# Patient Record
Sex: Female | Born: 1955 | Race: White | Hispanic: No | Smoking: Current some day smoker
Health system: Southern US, Community
[De-identification: ages and names within clinical notes are randomized; demographics above are authoritative.]

## PROBLEM LIST (undated history)

## (undated) DIAGNOSIS — I2510 Atherosclerotic heart disease of native coronary artery without angina pectoris: Secondary | ICD-10-CM

## (undated) DIAGNOSIS — Z973 Presence of spectacles and contact lenses: Secondary | ICD-10-CM

## (undated) DIAGNOSIS — K219 Gastro-esophageal reflux disease without esophagitis: Secondary | ICD-10-CM

## (undated) DIAGNOSIS — I2699 Other pulmonary embolism without acute cor pulmonale: Secondary | ICD-10-CM

## (undated) DIAGNOSIS — E785 Hyperlipidemia, unspecified: Secondary | ICD-10-CM

## (undated) DIAGNOSIS — R7303 Prediabetes: Secondary | ICD-10-CM

## (undated) DIAGNOSIS — M1990 Unspecified osteoarthritis, unspecified site: Secondary | ICD-10-CM

## (undated) DIAGNOSIS — F419 Anxiety disorder, unspecified: Secondary | ICD-10-CM

## (undated) DIAGNOSIS — I82409 Acute embolism and thrombosis of unspecified deep veins of unspecified lower extremity: Secondary | ICD-10-CM

## (undated) DIAGNOSIS — Z95828 Presence of other vascular implants and grafts: Secondary | ICD-10-CM

## (undated) DIAGNOSIS — M5430 Sciatica, unspecified side: Secondary | ICD-10-CM

## (undated) HISTORY — PX: OTHER SURGICAL HISTORY: SHX169

## (undated) HISTORY — PX: BREAST SURGERY: SHX581

## (undated) HISTORY — PX: ANGIOPLASTY: SHX39

## (undated) HISTORY — PX: ABDOMINAL SURGERY: SHX537

## (undated) HISTORY — PX: BACK SURGERY: SHX140

## (undated) HISTORY — PX: KNEE SURGERY: SHX244

## (undated) HISTORY — PX: ABDOMINAL HYSTERECTOMY: SHX81

---

## 2003-08-18 ENCOUNTER — Observation Stay (HOSPITAL_COMMUNITY): Admission: EM | Admit: 2003-08-18 | Discharge: 2003-08-19 | Payer: Self-pay | Admitting: Cardiology

## 2004-06-06 ENCOUNTER — Encounter: Admission: RE | Admit: 2004-06-06 | Discharge: 2004-06-06 | Payer: Self-pay | Admitting: Family Medicine

## 2004-07-11 ENCOUNTER — Encounter: Admission: RE | Admit: 2004-07-11 | Discharge: 2004-07-11 | Payer: Self-pay | Admitting: Family Medicine

## 2004-09-19 ENCOUNTER — Ambulatory Visit: Payer: Self-pay | Admitting: Family Medicine

## 2004-10-17 ENCOUNTER — Ambulatory Visit: Payer: Self-pay | Admitting: Family Medicine

## 2004-10-25 ENCOUNTER — Emergency Department (HOSPITAL_COMMUNITY): Admission: EM | Admit: 2004-10-25 | Discharge: 2004-10-26 | Payer: Self-pay | Admitting: Emergency Medicine

## 2004-11-08 ENCOUNTER — Encounter: Admission: RE | Admit: 2004-11-08 | Discharge: 2004-11-08 | Payer: Self-pay | Admitting: Sports Medicine

## 2005-01-02 ENCOUNTER — Ambulatory Visit: Payer: Self-pay | Admitting: Family Medicine

## 2005-01-09 ENCOUNTER — Ambulatory Visit: Payer: Self-pay | Admitting: Family Medicine

## 2005-05-28 ENCOUNTER — Encounter: Admission: RE | Admit: 2005-05-28 | Discharge: 2005-08-26 | Payer: Self-pay | Admitting: Occupational Medicine

## 2005-06-12 ENCOUNTER — Ambulatory Visit: Payer: Self-pay | Admitting: Family Medicine

## 2005-10-16 ENCOUNTER — Encounter: Admission: RE | Admit: 2005-10-16 | Discharge: 2005-10-16 | Payer: Self-pay | Admitting: Neurosurgery

## 2006-01-16 ENCOUNTER — Encounter: Admission: RE | Admit: 2006-01-16 | Discharge: 2006-01-16 | Payer: Self-pay | Admitting: Neurosurgery

## 2006-06-10 ENCOUNTER — Ambulatory Visit: Payer: Self-pay | Admitting: Family Medicine

## 2006-07-13 ENCOUNTER — Emergency Department (HOSPITAL_COMMUNITY): Admission: AD | Admit: 2006-07-13 | Discharge: 2006-07-13 | Payer: Self-pay | Admitting: Family Medicine

## 2006-10-23 ENCOUNTER — Encounter: Admission: RE | Admit: 2006-10-23 | Discharge: 2006-11-08 | Payer: Self-pay | Admitting: Neurosurgery

## 2006-12-05 ENCOUNTER — Encounter: Admission: RE | Admit: 2006-12-05 | Discharge: 2006-12-05 | Payer: Self-pay | Admitting: Neurosurgery

## 2007-02-20 DIAGNOSIS — F32A Depression, unspecified: Secondary | ICD-10-CM | POA: Insufficient documentation

## 2007-02-20 DIAGNOSIS — K219 Gastro-esophageal reflux disease without esophagitis: Secondary | ICD-10-CM | POA: Insufficient documentation

## 2007-02-20 DIAGNOSIS — M79609 Pain in unspecified limb: Secondary | ICD-10-CM

## 2007-02-20 DIAGNOSIS — G2589 Other specified extrapyramidal and movement disorders: Secondary | ICD-10-CM

## 2007-02-20 DIAGNOSIS — Z87891 Personal history of nicotine dependence: Secondary | ICD-10-CM

## 2007-02-20 DIAGNOSIS — F339 Major depressive disorder, recurrent, unspecified: Secondary | ICD-10-CM | POA: Insufficient documentation

## 2007-02-20 DIAGNOSIS — G4700 Insomnia, unspecified: Secondary | ICD-10-CM

## 2007-02-20 DIAGNOSIS — E669 Obesity, unspecified: Secondary | ICD-10-CM | POA: Insufficient documentation

## 2007-02-20 DIAGNOSIS — K5900 Constipation, unspecified: Secondary | ICD-10-CM | POA: Insufficient documentation

## 2007-07-07 ENCOUNTER — Ambulatory Visit (HOSPITAL_COMMUNITY): Admission: RE | Admit: 2007-07-07 | Discharge: 2007-07-07 | Payer: Self-pay | Admitting: Nurse Practitioner

## 2007-07-07 ENCOUNTER — Ambulatory Visit: Payer: Self-pay | Admitting: Family Medicine

## 2007-07-07 ENCOUNTER — Ambulatory Visit: Payer: Self-pay | Admitting: *Deleted

## 2007-07-07 ENCOUNTER — Ambulatory Visit: Payer: Self-pay | Admitting: Internal Medicine

## 2007-08-04 ENCOUNTER — Ambulatory Visit: Payer: Self-pay | Admitting: Internal Medicine

## 2007-09-02 ENCOUNTER — Ambulatory Visit: Payer: Self-pay | Admitting: Internal Medicine

## 2007-09-02 ENCOUNTER — Encounter (INDEPENDENT_AMBULATORY_CARE_PROVIDER_SITE_OTHER): Payer: Self-pay | Admitting: Nurse Practitioner

## 2007-09-02 LAB — CONVERTED CEMR LAB
ALT: 18 units/L (ref 0–35)
AST: 15 units/L (ref 0–37)
Albumin: 4.5 g/dL (ref 3.5–5.2)
Alkaline Phosphatase: 84 units/L (ref 39–117)
Bilirubin, Direct: 0.1 mg/dL (ref 0.0–0.3)
Cholesterol: 247 mg/dL — ABNORMAL HIGH (ref 0–200)
HDL: 39 mg/dL — ABNORMAL LOW (ref >39)
Indirect Bilirubin: 0.4 mg/dL (ref 0.0–0.9)
LDL Cholesterol: 132 mg/dL — ABNORMAL HIGH (ref 0–99)
Total Bilirubin: 0.5 mg/dL (ref 0.3–1.2)
Total CHOL/HDL Ratio: 6.3
VLDL: 76 mg/dL — ABNORMAL HIGH (ref 0–40)

## 2007-12-03 ENCOUNTER — Encounter (INDEPENDENT_AMBULATORY_CARE_PROVIDER_SITE_OTHER): Payer: Self-pay | Admitting: Nurse Practitioner

## 2007-12-03 ENCOUNTER — Ambulatory Visit: Payer: Self-pay | Admitting: Internal Medicine

## 2007-12-03 LAB — CONVERTED CEMR LAB
ALT: 19 units/L (ref 0–35)
AST: 13 units/L (ref 0–37)
Albumin: 4.5 g/dL (ref 3.5–5.2)
Alkaline Phosphatase: 89 units/L (ref 39–117)
Bilirubin, Direct: 0.1 mg/dL (ref 0.0–0.3)
Cholesterol: 192 mg/dL (ref 0–200)
HDL: 44 mg/dL (ref >39)
Indirect Bilirubin: 0.3 mg/dL (ref 0.0–0.9)
LDL Cholesterol: 93 mg/dL (ref 0–99)
Total Bilirubin: 0.4 mg/dL (ref 0.3–1.2)
Total CHOL/HDL Ratio: 4.4
Total Protein: 7.1 g/dL (ref 6.0–8.3)
Triglycerides: 275 mg/dL — ABNORMAL HIGH (ref <150)
VLDL: 55 mg/dL — ABNORMAL HIGH (ref 0–40)

## 2007-12-15 ENCOUNTER — Ambulatory Visit: Payer: Self-pay | Admitting: Internal Medicine

## 2008-02-19 ENCOUNTER — Encounter: Payer: Self-pay | Admitting: Family Medicine

## 2008-07-05 ENCOUNTER — Encounter (INDEPENDENT_AMBULATORY_CARE_PROVIDER_SITE_OTHER): Payer: Self-pay | Admitting: Family Medicine

## 2008-07-05 ENCOUNTER — Ambulatory Visit: Payer: Self-pay | Admitting: Internal Medicine

## 2008-07-05 LAB — CONVERTED CEMR LAB
ALT: 19 units/L (ref 0–35)
AST: 13 units/L (ref 0–37)
Albumin: 4.7 g/dL (ref 3.5–5.2)
Alkaline Phosphatase: 95 units/L (ref 39–117)
BUN: 14 mg/dL (ref 6–23)
Basophils Absolute: 0.1 10*3/uL (ref 0.0–0.1)
Basophils Relative: 1 % (ref 0–1)
CO2: 22 meq/L (ref 19–32)
Calcium: 9.6 mg/dL (ref 8.4–10.5)
Chloride: 103 meq/L (ref 96–112)
Cholesterol: 204 mg/dL — ABNORMAL HIGH (ref 0–200)
Creatinine, Ser: 0.85 mg/dL (ref 0.40–1.20)
Eosinophils Absolute: 0.2 10*3/uL (ref 0.0–0.7)
Eosinophils Relative: 2 % (ref 0–5)
Glucose, Bld: 84 mg/dL (ref 70–99)
HCT: 48.2 % — ABNORMAL HIGH (ref 36.0–46.0)
HDL: 43 mg/dL (ref >39)
Hemoglobin: 16.1 g/dL — ABNORMAL HIGH (ref 12.0–15.0)
LDL Cholesterol: 106 mg/dL — ABNORMAL HIGH (ref 0–99)
Lymphocytes Relative: 32 % (ref 12–46)
Lymphs Abs: 2.6 10*3/uL (ref 0.7–4.0)
MCHC: 33.4 g/dL (ref 30.0–36.0)
MCV: 93.4 fL (ref 78.0–100.0)
Monocytes Absolute: 0.5 10*3/uL (ref 0.1–1.0)
Monocytes Relative: 5 % (ref 3–12)
Neutro Abs: 5 10*3/uL (ref 1.7–7.7)
Neutrophils Relative %: 60 % (ref 43–77)
Platelets: 232 10*3/uL (ref 150–400)
Potassium: 4.9 meq/L (ref 3.5–5.3)
RBC: 5.16 M/uL — ABNORMAL HIGH (ref 3.87–5.11)
RDW: 14.8 % (ref 11.5–15.5)
Sodium: 139 meq/L (ref 135–145)
TSH: 3.934 microintl units/mL (ref 0.350–4.50)
Total Bilirubin: 0.4 mg/dL (ref 0.3–1.2)
Total CHOL/HDL Ratio: 4.7
Total Protein: 7.4 g/dL (ref 6.0–8.3)
Triglycerides: 273 mg/dL — ABNORMAL HIGH (ref <150)
VLDL: 55 mg/dL — ABNORMAL HIGH (ref 0–40)
WBC: 8.3 10*3/uL (ref 4.0–10.5)

## 2008-07-13 ENCOUNTER — Ambulatory Visit (HOSPITAL_COMMUNITY): Admission: RE | Admit: 2008-07-13 | Discharge: 2008-07-13 | Payer: Self-pay | Admitting: Internal Medicine

## 2008-07-13 ENCOUNTER — Encounter: Payer: Self-pay | Admitting: Internal Medicine

## 2008-07-13 ENCOUNTER — Ambulatory Visit: Payer: Self-pay | Admitting: Vascular Surgery

## 2008-09-06 ENCOUNTER — Ambulatory Visit: Payer: Self-pay | Admitting: Internal Medicine

## 2008-09-06 ENCOUNTER — Encounter (INDEPENDENT_AMBULATORY_CARE_PROVIDER_SITE_OTHER): Payer: Self-pay | Admitting: Family Medicine

## 2008-09-06 LAB — CONVERTED CEMR LAB
ALT: 17 units/L (ref 0–35)
AST: 12 units/L (ref 0–37)
Albumin: 4.7 g/dL (ref 3.5–5.2)
Alkaline Phosphatase: 98 units/L (ref 39–117)
BUN: 20 mg/dL (ref 6–23)
CO2: 25 meq/L (ref 19–32)
Calcium: 9.6 mg/dL (ref 8.4–10.5)
Chloride: 105 meq/L (ref 96–112)
Cholesterol: 153 mg/dL (ref 0–200)
Creatinine, Ser: 0.88 mg/dL (ref 0.40–1.20)
HDL: 55 mg/dL (ref >39)
Potassium: 4.2 meq/L (ref 3.5–5.3)
Sodium: 140 meq/L (ref 135–145)
Total Bilirubin: 0.4 mg/dL (ref 0.3–1.2)
Total CHOL/HDL Ratio: 2.8
Triglycerides: 102 mg/dL (ref <150)

## 2011-05-11 NOTE — Cardiovascular Report (Signed)
NAME:  Laura Hodges, Laura Hodges                           ACCOUNT NO.:  [id number]   MEDICAL RECORD NO.:  [id number]                   PATIENT TYPE:  INP   LOCATION:  2010                                 FACILITY:  [facility]   PHYSICIAN:  Salvadore Farber, M.D.             DATE OF BIRTH:  1956-07-08   DATE OF PROCEDURE:  08/19/2003  DATE OF DISCHARGE:                              CARDIAC CATHETERIZATION   PROCEDURE:  Left heart catheterization, left ventriculography, coronary  angiography.   INDICATIONS:  [name] is a 55 year old lady with risk factors of positive  family history, obesity, and ongoing tobacco abuse who presents with  multiple episodes of brief, but crushing substernal chest pain.  She was  initially evaluated at [facility] and signed out [facility].  She subsequently represented after multiple episodes of pain.  She was  hospitalized and has ruled out for myocardial infarction.  Electrocardiogram  is normal.  Based on her multiple episodes of pain and risk factors, she was  referred directly for coronary angiography.   PROCEDURAL TECHNIQUE:  Informed consent was obtained.  Under 1% lidocaine  local anesthesia a 6-French sheath was placed in the right femoral artery  using the modified Seldinger technique.  Diagnostic angiography and  ventriculography were performed using JL4, JR4, and pigtail catheters.  The  patient tolerated the procedure well and was transferred to the holding room  in stable condition.   COMPLICATIONS:  None.   FINDINGS:  1. LV 107/4/14.  EF 70% without regional wall motion abnormality.  2. No aortic stenosis or mitral regurgitation.  3. Left main:  Angiographically normal.  4. LAD:  Moderate sized vessel giving rise to three diagonal branches.  It     is angiographically normal.  5. Ramus intermedius:  Moderate sized vessel which is angiographically     normal.  6. Circumflex:  Moderate sized vessel giving rise to two obtuse  marginals     which is angiographically normal.  7. RCA:  Moderate sized dominant vessel which has a moderate area of ectasia     in its proximal portion.  There is no stenosis or luminal irregularity.    IMPRESSION/RECOMMENDATIONS:  Normal coronaries with the exception of a  modest area of ectasia in the proximal right coronary.  Recommend continued  primary prevention including smoking cessation.  Suspect noncardiac etiology  to her chest discomfort.  She has been initiated on calcium channel blocker  for consideration of esophageal spasm.  She will follow up with Dr.  Jeanie Sewer.                                               Salvadore Farber, M.D.    WED/MEDQ  D:  08/19/2003  T:  08/19/2003  Job:  [id number]   cc:   Heide Guile, MD  [address]  [place]  [place] [address]  Fax: [phone]   Myra Rude, M.D.

## 2016-01-11 DIAGNOSIS — M25551 Pain in right hip: Secondary | ICD-10-CM | POA: Diagnosis not present

## 2016-01-11 DIAGNOSIS — I10 Essential (primary) hypertension: Secondary | ICD-10-CM | POA: Diagnosis not present

## 2016-01-11 DIAGNOSIS — I2510 Atherosclerotic heart disease of native coronary artery without angina pectoris: Secondary | ICD-10-CM | POA: Diagnosis not present

## 2016-01-11 DIAGNOSIS — E785 Hyperlipidemia, unspecified: Secondary | ICD-10-CM | POA: Diagnosis not present

## 2016-01-17 DIAGNOSIS — E785 Hyperlipidemia, unspecified: Secondary | ICD-10-CM | POA: Diagnosis not present

## 2016-01-17 DIAGNOSIS — I2510 Atherosclerotic heart disease of native coronary artery without angina pectoris: Secondary | ICD-10-CM | POA: Diagnosis not present

## 2016-01-17 DIAGNOSIS — I10 Essential (primary) hypertension: Secondary | ICD-10-CM | POA: Diagnosis not present

## 2016-01-17 DIAGNOSIS — K219 Gastro-esophageal reflux disease without esophagitis: Secondary | ICD-10-CM | POA: Diagnosis not present

## 2016-01-17 DIAGNOSIS — J209 Acute bronchitis, unspecified: Secondary | ICD-10-CM | POA: Diagnosis not present

## 2016-02-08 DIAGNOSIS — I10 Essential (primary) hypertension: Secondary | ICD-10-CM | POA: Diagnosis not present

## 2016-02-08 DIAGNOSIS — I2510 Atherosclerotic heart disease of native coronary artery without angina pectoris: Secondary | ICD-10-CM | POA: Diagnosis not present

## 2016-02-08 DIAGNOSIS — E785 Hyperlipidemia, unspecified: Secondary | ICD-10-CM | POA: Diagnosis not present

## 2016-02-08 DIAGNOSIS — G4700 Insomnia, unspecified: Secondary | ICD-10-CM | POA: Diagnosis not present

## 2016-02-08 DIAGNOSIS — K219 Gastro-esophageal reflux disease without esophagitis: Secondary | ICD-10-CM | POA: Diagnosis not present

## 2016-02-23 DIAGNOSIS — Z1231 Encounter for screening mammogram for malignant neoplasm of breast: Secondary | ICD-10-CM | POA: Diagnosis not present

## 2016-02-28 DIAGNOSIS — M238X2 Other internal derangements of left knee: Secondary | ICD-10-CM | POA: Diagnosis not present

## 2016-02-28 DIAGNOSIS — M25562 Pain in left knee: Secondary | ICD-10-CM | POA: Diagnosis not present

## 2016-03-07 DIAGNOSIS — Z7901 Long term (current) use of anticoagulants: Secondary | ICD-10-CM | POA: Diagnosis not present

## 2016-03-07 DIAGNOSIS — I2510 Atherosclerotic heart disease of native coronary artery without angina pectoris: Secondary | ICD-10-CM | POA: Diagnosis not present

## 2016-03-07 DIAGNOSIS — M25551 Pain in right hip: Secondary | ICD-10-CM | POA: Diagnosis not present

## 2016-03-07 DIAGNOSIS — E785 Hyperlipidemia, unspecified: Secondary | ICD-10-CM | POA: Diagnosis not present

## 2016-03-07 DIAGNOSIS — I10 Essential (primary) hypertension: Secondary | ICD-10-CM | POA: Diagnosis not present

## 2016-03-09 DIAGNOSIS — M25562 Pain in left knee: Secondary | ICD-10-CM | POA: Diagnosis not present

## 2016-03-26 DIAGNOSIS — S83242A Other tear of medial meniscus, current injury, left knee, initial encounter: Secondary | ICD-10-CM | POA: Diagnosis not present

## 2016-03-26 DIAGNOSIS — M25562 Pain in left knee: Secondary | ICD-10-CM | POA: Diagnosis not present

## 2016-03-26 DIAGNOSIS — M1712 Unilateral primary osteoarthritis, left knee: Secondary | ICD-10-CM | POA: Diagnosis not present

## 2016-03-28 DIAGNOSIS — E785 Hyperlipidemia, unspecified: Secondary | ICD-10-CM | POA: Diagnosis not present

## 2016-03-28 DIAGNOSIS — I2510 Atherosclerotic heart disease of native coronary artery without angina pectoris: Secondary | ICD-10-CM | POA: Diagnosis not present

## 2016-03-28 DIAGNOSIS — K219 Gastro-esophageal reflux disease without esophagitis: Secondary | ICD-10-CM | POA: Diagnosis not present

## 2016-03-28 DIAGNOSIS — Z01810 Encounter for preprocedural cardiovascular examination: Secondary | ICD-10-CM | POA: Diagnosis not present

## 2016-03-28 DIAGNOSIS — I10 Essential (primary) hypertension: Secondary | ICD-10-CM | POA: Diagnosis not present

## 2016-04-02 DIAGNOSIS — I2510 Atherosclerotic heart disease of native coronary artery without angina pectoris: Secondary | ICD-10-CM | POA: Diagnosis not present

## 2016-04-02 DIAGNOSIS — G4700 Insomnia, unspecified: Secondary | ICD-10-CM | POA: Diagnosis not present

## 2016-04-02 DIAGNOSIS — K219 Gastro-esophageal reflux disease without esophagitis: Secondary | ICD-10-CM | POA: Diagnosis not present

## 2016-04-02 DIAGNOSIS — E785 Hyperlipidemia, unspecified: Secondary | ICD-10-CM | POA: Diagnosis not present

## 2016-04-02 DIAGNOSIS — I10 Essential (primary) hypertension: Secondary | ICD-10-CM | POA: Diagnosis not present

## 2016-04-04 DIAGNOSIS — Z7901 Long term (current) use of anticoagulants: Secondary | ICD-10-CM | POA: Diagnosis not present

## 2016-04-11 DIAGNOSIS — E785 Hyperlipidemia, unspecified: Secondary | ICD-10-CM | POA: Diagnosis not present

## 2016-04-11 DIAGNOSIS — Z79899 Other long term (current) drug therapy: Secondary | ICD-10-CM | POA: Diagnosis not present

## 2016-04-11 DIAGNOSIS — R791 Abnormal coagulation profile: Secondary | ICD-10-CM | POA: Diagnosis not present

## 2016-04-11 DIAGNOSIS — K219 Gastro-esophageal reflux disease without esophagitis: Secondary | ICD-10-CM | POA: Diagnosis not present

## 2016-04-11 DIAGNOSIS — M25551 Pain in right hip: Secondary | ICD-10-CM | POA: Diagnosis not present

## 2016-04-11 DIAGNOSIS — M159 Polyosteoarthritis, unspecified: Secondary | ICD-10-CM | POA: Diagnosis not present

## 2016-04-23 DIAGNOSIS — Z86711 Personal history of pulmonary embolism: Secondary | ICD-10-CM | POA: Insufficient documentation

## 2016-04-23 DIAGNOSIS — Z01810 Encounter for preprocedural cardiovascular examination: Secondary | ICD-10-CM | POA: Diagnosis not present

## 2016-04-23 DIAGNOSIS — E785 Hyperlipidemia, unspecified: Secondary | ICD-10-CM | POA: Insufficient documentation

## 2016-04-23 DIAGNOSIS — I2510 Atherosclerotic heart disease of native coronary artery without angina pectoris: Secondary | ICD-10-CM | POA: Insufficient documentation

## 2016-04-24 DIAGNOSIS — R791 Abnormal coagulation profile: Secondary | ICD-10-CM | POA: Diagnosis not present

## 2016-04-24 DIAGNOSIS — E785 Hyperlipidemia, unspecified: Secondary | ICD-10-CM | POA: Diagnosis not present

## 2016-04-24 DIAGNOSIS — G4700 Insomnia, unspecified: Secondary | ICD-10-CM | POA: Diagnosis not present

## 2016-04-24 DIAGNOSIS — K219 Gastro-esophageal reflux disease without esophagitis: Secondary | ICD-10-CM | POA: Diagnosis not present

## 2016-04-24 DIAGNOSIS — I2510 Atherosclerotic heart disease of native coronary artery without angina pectoris: Secondary | ICD-10-CM | POA: Diagnosis not present

## 2016-04-27 DIAGNOSIS — E785 Hyperlipidemia, unspecified: Secondary | ICD-10-CM | POA: Diagnosis not present

## 2016-04-27 DIAGNOSIS — Z01810 Encounter for preprocedural cardiovascular examination: Secondary | ICD-10-CM | POA: Diagnosis not present

## 2016-04-27 DIAGNOSIS — I2510 Atherosclerotic heart disease of native coronary artery without angina pectoris: Secondary | ICD-10-CM | POA: Diagnosis not present

## 2016-04-27 DIAGNOSIS — Z86711 Personal history of pulmonary embolism: Secondary | ICD-10-CM | POA: Diagnosis not present

## 2016-04-30 DIAGNOSIS — Z01810 Encounter for preprocedural cardiovascular examination: Secondary | ICD-10-CM | POA: Diagnosis not present

## 2016-04-30 DIAGNOSIS — E785 Hyperlipidemia, unspecified: Secondary | ICD-10-CM | POA: Diagnosis not present

## 2016-04-30 DIAGNOSIS — I2510 Atherosclerotic heart disease of native coronary artery without angina pectoris: Secondary | ICD-10-CM | POA: Diagnosis not present

## 2016-04-30 DIAGNOSIS — Z86711 Personal history of pulmonary embolism: Secondary | ICD-10-CM | POA: Diagnosis not present

## 2016-05-01 DIAGNOSIS — K219 Gastro-esophageal reflux disease without esophagitis: Secondary | ICD-10-CM | POA: Diagnosis not present

## 2016-05-01 DIAGNOSIS — Z79899 Other long term (current) drug therapy: Secondary | ICD-10-CM | POA: Diagnosis not present

## 2016-05-01 DIAGNOSIS — G4700 Insomnia, unspecified: Secondary | ICD-10-CM | POA: Diagnosis not present

## 2016-05-01 DIAGNOSIS — I2510 Atherosclerotic heart disease of native coronary artery without angina pectoris: Secondary | ICD-10-CM | POA: Diagnosis not present

## 2016-05-01 DIAGNOSIS — E785 Hyperlipidemia, unspecified: Secondary | ICD-10-CM | POA: Diagnosis not present

## 2016-05-02 DIAGNOSIS — S83242D Other tear of medial meniscus, current injury, left knee, subsequent encounter: Secondary | ICD-10-CM | POA: Diagnosis not present

## 2016-05-02 DIAGNOSIS — M1712 Unilateral primary osteoarthritis, left knee: Secondary | ICD-10-CM | POA: Diagnosis not present

## 2016-05-18 DIAGNOSIS — M1712 Unilateral primary osteoarthritis, left knee: Secondary | ICD-10-CM | POA: Diagnosis not present

## 2016-05-18 DIAGNOSIS — M5416 Radiculopathy, lumbar region: Secondary | ICD-10-CM | POA: Diagnosis not present

## 2016-05-31 DIAGNOSIS — K219 Gastro-esophageal reflux disease without esophagitis: Secondary | ICD-10-CM | POA: Diagnosis not present

## 2016-05-31 DIAGNOSIS — I10 Essential (primary) hypertension: Secondary | ICD-10-CM | POA: Diagnosis not present

## 2016-05-31 DIAGNOSIS — E785 Hyperlipidemia, unspecified: Secondary | ICD-10-CM | POA: Diagnosis not present

## 2016-05-31 DIAGNOSIS — G4700 Insomnia, unspecified: Secondary | ICD-10-CM | POA: Diagnosis not present

## 2016-05-31 DIAGNOSIS — I2510 Atherosclerotic heart disease of native coronary artery without angina pectoris: Secondary | ICD-10-CM | POA: Diagnosis not present

## 2016-06-18 DIAGNOSIS — I2699 Other pulmonary embolism without acute cor pulmonale: Secondary | ICD-10-CM | POA: Diagnosis not present

## 2016-06-18 DIAGNOSIS — G894 Chronic pain syndrome: Secondary | ICD-10-CM | POA: Diagnosis not present

## 2016-06-18 DIAGNOSIS — M545 Low back pain: Secondary | ICD-10-CM | POA: Diagnosis not present

## 2016-06-18 DIAGNOSIS — M25569 Pain in unspecified knee: Secondary | ICD-10-CM | POA: Diagnosis not present

## 2016-06-18 DIAGNOSIS — Z86718 Personal history of other venous thrombosis and embolism: Secondary | ICD-10-CM | POA: Diagnosis not present

## 2016-07-02 DIAGNOSIS — K219 Gastro-esophageal reflux disease without esophagitis: Secondary | ICD-10-CM | POA: Diagnosis not present

## 2016-07-02 DIAGNOSIS — M25551 Pain in right hip: Secondary | ICD-10-CM | POA: Diagnosis not present

## 2016-07-02 DIAGNOSIS — E785 Hyperlipidemia, unspecified: Secondary | ICD-10-CM | POA: Diagnosis not present

## 2016-07-02 DIAGNOSIS — Z7901 Long term (current) use of anticoagulants: Secondary | ICD-10-CM | POA: Diagnosis not present

## 2016-07-02 DIAGNOSIS — G4700 Insomnia, unspecified: Secondary | ICD-10-CM | POA: Diagnosis not present

## 2016-07-09 DIAGNOSIS — Z7901 Long term (current) use of anticoagulants: Secondary | ICD-10-CM | POA: Diagnosis not present

## 2016-07-09 NOTE — Progress Notes (Signed)
04/23/2016-last office visit from Dr. Normajean Baxteravankar on chart. 04/28/2016-EKG from Dr. Blanchie Serveavanchar and Echo report on [facility] on chart.Marland Kitchen. 04/30/2016-Nuclear Cardiology report from [facility] [place] on chart.

## 2016-07-09 NOTE — Patient Instructions (Addendum)
Laura Hodges  07/09/2016   Your procedure is scheduled on: Tuesday 07/17/2016  [facility]  [place] to 3rd floor to  Short Stay Center at  1245 PM.  Call this number if you have problems the morning of surgery [phone]   Remember: ONLY 1 PERSON MAY GO WITH YOU TO SHORT STAY TO GET  READY MORNING OF YOUR SURGERY.   Do not eat food  :After Midnight.  MAY HAVE CLEAR LIQUIDS FROM MIDNIGHT UP UNTIL 0945 AM THEN NOTHING UNTIL AFTER SURGERY!  SEE LIST BELOW!   CLEAR LIQUID DIET   Foods Allowed                                                                     Foods Excluded  Coffee and tea, regular and decaf  (NO MILKOR CREAM ADDED  )                                                                                                              LIQUIDS YOU CANNOT Plain Jell-O in any flavor                                           see through such as: Fruit ices (not with fruit pulp)                                     milk, soups, orange juice  Iced Popsicles                                    All solid food Carbonated beverages, regular and diet                                    Cranberry, grape and apple juices Sports drinks like Gatorade Lightly seasoned clear broth or consume(fat free) Sugar, honey syrup  Sample Menu Breakfast                                Lunch                                     Supper Cranberry juice  Beef broth                            Chicken broth Jell-O                                     Grape juice                           Apple juice Coffee or tea                        Jell-O                                      Popsicle                                                Coffee or tea                        Coffee or tea  _____________________________________________________________________       Take these medicines the morning of surgery with A SIP OF WATER: Valium if  needed; Omeprazole; Citalopram; Neurontin; Percocet                                  You may not have any metal on your body including hair pins and              piercings  Do not wear jewelry, make-up, lotions, powders or perfumes, deodorant             Do not wear nail polish.  Do not shave  48 hours prior to surgery.                Do not bring valuables to the hospital. [facility] IS NOT             RESPONSIBLE   FOR VALUABLES.  Contacts, dentures or bridgework may not be worn into surgery.  Leave suitcase in the car. After surgery it may be brought to your room.                  Please read over the following fact sheets you were given:MRSA INFORMATION SHEET; INCENTIVE SPIROMETER; BLOOD TRANSFUSION INFORMATION SHEET  _____________________________________________________________________             [facility] - Preparing for Surgery Before surgery, you can play an important role.  Because skin is not sterile, your skin needs to be as free of germs as possible.  You can reduce the number of germs on your skin by washing with CHG (chlorahexidine gluconate) soap before surgery.  CHG is an antiseptic cleaner which kills germs and bonds with the skin to continue killing germs even after washing. Please DO NOT use if you have an allergy to CHG or antibacterial soaps.  If your skin becomes reddened/irritated stop using the CHG and inform your nurse when you arrive at Short Stay. Do not shave (including legs and underarms) for at least 48  hours prior to the first CHG shower.  You may shave your face/neck. Please follow these instructions carefully:  1.  Shower with CHG Soap the night before surgery and the  morning of Surgery.  2.  If you choose to wash your hair, wash your hair first as usual with your  normal  shampoo.  3.  After you shampoo, rinse your hair and body thoroughly to remove the  shampoo.                           4.  Use CHG as you would any other liquid soap.  You can apply  chg directly  to the skin and wash                       Gently with a scrungie or clean washcloth.  5.  Apply the CHG Soap to your body ONLY FROM THE NECK DOWN.   Do not use on face/ open                           Wound or open sores. Avoid contact with eyes, ears mouth and genitals (private parts).                       Wash face,  Genitals (private parts) with your normal soap.             6.  Wash thoroughly, paying special attention to the area where your surgery  will be performed.  7.  Thoroughly rinse your body with warm water from the neck down.  8.  DO NOT shower/wash with your normal soap after using and rinsing off  the CHG Soap.                9.  Pat yourself dry with a clean towel.            10.  Wear clean pajamas.            11.  Place clean sheets on your bed the night of your first shower and do not  sleep with pets. Day of Surgery : Do not apply any lotions/deodorants the morning of surgery.  Please wear clean clothes to the hospital/surgery center.  FAILURE TO FOLLOW THESE INSTRUCTIONS MAY RESULT IN THE CANCELLATION OF YOUR SURGERY PATIENT SIGNATURE_________________________________  NURSE SIGNATURE__________________________________  ________________________________________________________________________   Laura Hodges  An incentive spirometer is a tool that can help keep your lungs clear and active. This tool measures how well you are filling your lungs with each breath. Taking long deep breaths may help reverse or decrease the chance of developing breathing (pulmonary) problems (especially infection) following:  A long period of time when you are unable to move or be active. BEFORE THE PROCEDURE   If the spirometer includes an indicator to show your best effort, your nurse or respiratory therapist will set it to a desired goal.  If possible, sit up straight or lean slightly forward. Try not to slouch.  Hold the incentive spirometer in an upright  position. INSTRUCTIONS FOR USE   Sit on the edge of your bed if possible, or sit up as far as you can in bed or on a chair.  Hold the incentive spirometer in an upright position.  Breathe out normally.  Place the mouthpiece in your mouth and seal your lips tightly around it.  Breathe in slowly and as deeply as possible, raising the piston or the ball toward the top of the column.  Hold your breath for 3-5 seconds or for as long as possible. Allow the piston or ball to fall to the bottom of the column.  Remove the mouthpiece from your mouth and breathe out normally.  Rest for a few seconds and repeat Steps 1 through 7 at least 10 times every 1-2 hours when you are awake. Take your time and take a few normal breaths between deep breaths.  The spirometer may include an indicator to show your best effort. Use the indicator as a goal to work toward during each repetition.  After each set of 10 deep breaths, practice coughing to be sure your lungs are clear. If you have an incision (the cut made at the time of surgery), support your incision when coughing by placing a pillow or rolled up towels firmly against it. Once you are able to get out of bed, walk around indoors and cough well. You may stop using the incentive spirometer when instructed by your caregiver.  RISKS AND COMPLICATIONS  Take your time so you do not get dizzy or light-headed.  If you are in pain, you may need to take or ask for pain medication before doing incentive spirometry. It is harder to take a deep breath if you are having pain. AFTER USE  Rest and breathe slowly and easily.  It can be helpful to keep track of a log of your progress. Your caregiver can provide you with a simple table to help with this. If you are using the spirometer at home, follow these instructions: SEEK MEDICAL CARE IF:   You are having difficultly using the spirometer.  You have trouble using the spirometer as often as instructed.  Your  pain medication is not giving enough relief while using the spirometer.  You develop fever of 100.5 F (38.1 C) or higher. SEEK IMMEDIATE MEDICAL CARE IF:   You cough up bloody sputum that had not been present before.  You develop fever of 102 F (38.9 C) or greater.  You develop worsening pain at or near the incision site. MAKE SURE YOU:   Understand these instructions.  Will watch your condition.  Will get help right away if you are not doing well or get worse. Document Released: 04/22/2007 Document Revised: 03/03/2012 Document Reviewed: 06/23/2007 ExitCare Patient Information 2014 ExitCare, [place].   ________________________________________________________________________  WHAT IS A BLOOD TRANSFUSION? Blood Transfusion Information  A transfusion is the replacement of blood or some of its parts. Blood is made up of multiple cells which provide different functions.  Red blood cells carry oxygen and are used for blood loss replacement.  White blood cells fight against infection.  Platelets control bleeding.  Plasma helps clot blood.  Other blood products are available for specialized needs, such as hemophilia or other clotting disorders. BEFORE THE TRANSFUSION  Who gives blood for transfusions?   Healthy volunteers who are fully evaluated to make sure their blood is safe. This is blood bank blood. Transfusion therapy is the safest it has ever been in the practice of medicine. Before blood is taken from a donor, a complete history is taken to make sure that person has no history of diseases nor engages in risky social behavior (examples are intravenous drug use or sexual activity with multiple partners). The donor's travel history is screened to minimize risk of transmitting infections, such as malaria. The donated blood is tested  for signs of infectious diseases, such as HIV and hepatitis. The blood is then tested to be sure it is compatible with you in order to minimize the  chance of a transfusion reaction. If you or a relative donates blood, this is often done in anticipation of surgery and is not appropriate for emergency situations. It takes many days to process the donated blood. RISKS AND COMPLICATIONS Although transfusion therapy is very safe and saves many lives, the main dangers of transfusion include:   Getting an infectious disease.  Developing a transfusion reaction. This is an allergic reaction to something in the blood you were given. Every precaution is taken to prevent this. The decision to have a blood transfusion has been considered carefully by your caregiver before blood is given. Blood is not given unless the benefits outweigh the risks. AFTER THE TRANSFUSION  Right after receiving a blood transfusion, you will usually feel much better and more energetic. This is especially true if your red blood cells have gotten low (anemic). The transfusion raises the level of the red blood cells which carry oxygen, and this usually causes an energy increase.  The nurse administering the transfusion will monitor you carefully for complications. HOME CARE INSTRUCTIONS  No special instructions are needed after a transfusion. You may find your energy is better. Speak with your caregiver about any limitations on activity for underlying diseases you may have. SEEK MEDICAL CARE IF:   Your condition is not improving after your transfusion.  You develop redness or irritation at the intravenous (IV) site. SEEK IMMEDIATE MEDICAL CARE IF:  Any of the following symptoms occur over the next 12 hours:  Shaking chills.  You have a temperature by mouth above 102 F (38.9 C), not controlled by medicine.  Chest, back, or muscle pain.  People around you feel you are not acting correctly or are confused.  Shortness of breath or difficulty breathing.  Dizziness and fainting.  You get a rash or develop hives.  You have a decrease in urine output.  Your urine  turns a dark color or changes to pink, red, or brown. Any of the following symptoms occur over the next 10 days:  You have a temperature by mouth above 102 F (38.9 C), not controlled by medicine.  Shortness of breath.  Weakness after normal activity.  The white part of the eye turns yellow (jaundice).  You have a decrease in the amount of urine or are urinating less often.  Your urine turns a dark color or changes to pink, red, or brown. Document Released: 12/07/2000 Document Revised: 03/03/2012 Document Reviewed: 07/26/2008 [facility] Patient Information 2014 [place], [place].  _______________________________________________________________________

## 2016-07-10 ENCOUNTER — Encounter (HOSPITAL_COMMUNITY)
Admission: RE | Admit: 2016-07-10 | Discharge: 2016-07-10 | Disposition: A | Discharge status: Home / Self Care | Payer: Medicare Other | Source: Ambulatory Visit | Attending: Orthopedic Surgery | Admitting: Orthopedic Surgery

## 2016-07-10 ENCOUNTER — Encounter (HOSPITAL_COMMUNITY): Payer: Self-pay

## 2016-07-10 DIAGNOSIS — Z01812 Encounter for preprocedural laboratory examination: Secondary | ICD-10-CM | POA: Diagnosis not present

## 2016-07-10 HISTORY — DX: Atherosclerotic heart disease of native coronary artery without angina pectoris: I25.10

## 2016-07-10 HISTORY — DX: Other pulmonary embolism without acute cor pulmonale: I26.99

## 2016-07-10 HISTORY — DX: Anxiety disorder, unspecified: F41.9

## 2016-07-10 HISTORY — DX: Presence of other vascular implants and grafts: Z95.828

## 2016-07-10 HISTORY — DX: Hyperlipidemia, unspecified: E78.5

## 2016-07-10 HISTORY — DX: Presence of spectacles and contact lenses: Z97.3

## 2016-07-10 HISTORY — DX: Gastro-esophageal reflux disease without esophagitis: K21.9

## 2016-07-10 HISTORY — DX: Acute embolism and thrombosis of unspecified deep veins of unspecified lower extremity: I82.409

## 2016-07-10 HISTORY — DX: Unspecified osteoarthritis, unspecified site: M19.90

## 2016-07-10 HISTORY — DX: Sciatica, unspecified side: M54.30

## 2016-07-10 LAB — CBC
HCT: 31.2 % — ABNORMAL LOW (ref 36.0–46.0)
Hemoglobin: 8.9 g/dL — ABNORMAL LOW (ref 12.0–15.0)
MCH: 22.6 pg — ABNORMAL LOW (ref 26.0–34.0)
MCHC: 28.5 g/dL — AB (ref 30.0–36.0)
MCV: 79.4 fL (ref 78.0–100.0)
PLATELETS: 401 10*3/uL — AB (ref 150–400)
RBC: 3.93 MIL/uL (ref 3.87–5.11)
RDW: 18.5 % — AB (ref 11.5–15.5)
WBC: 11.1 10*3/uL — AB (ref 4.0–10.5)

## 2016-07-10 LAB — BASIC METABOLIC PANEL
Anion gap: 8 (ref 5–15)
BUN: 16 mg/dL (ref 6–20)
CHLORIDE: 103 mmol/L (ref 101–111)
CO2: 24 mmol/L (ref 22–32)
CREATININE: 0.77 mg/dL (ref 0.44–1.00)
Calcium: 9 mg/dL (ref 8.9–10.3)
GFR calc Af Amer: >60 mL/min (ref >60)
GFR calc non Af Amer: >60 mL/min (ref >60)
Glucose, Bld: 96 mg/dL (ref 65–99)
Potassium: 4.4 mmol/L (ref 3.5–5.1)
SODIUM: 135 mmol/L (ref 135–145)

## 2016-07-10 LAB — ABO/RH: ABO/RH(D): A POS

## 2016-07-10 LAB — SURGICAL PCR SCREEN
MRSA, PCR: NEGATIVE
STAPHYLOCOCCUS AUREUS: NEGATIVE

## 2016-07-10 NOTE — Progress Notes (Signed)
CBC results per epic per PAT visit 07/10/2016 sent to Dr Charlann Boxerlin

## 2016-07-11 NOTE — H&P (Signed)
TOTAL KNEE ADMISSION H&P  Patient is being admitted for left total knee arthroplasty.  Subjective:  Chief Complaint:  Left knee primary OA / pain  HPI: Laura Hodges, 60 y.o. female, has a history of pain and functional disability in the left knee due to arthritis and has failed non-surgical conservative treatments for greater than 12 weeks to include NSAID's and/or analgesics, corticosteriod injections, use of assistive devices and activity modification.  Onset of symptoms was gradual, starting 2+ years ago with gradually worsening course since that time. The patient noted prior procedures on the knee to include  arthroscopy on the left knee(s).  Patient currently rates pain in the left knee(s) at 9 out of 10 with activity. Patient has night pain, worsening of pain with activity and weight bearing, pain that interferes with activities of daily living, pain with passive range of motion, crepitus and joint swelling.  Patient has evidence of periarticular osteophytes and joint space narrowing by imaging studies.  There is no active infection.   Risks, benefits and expectations were discussed with the patient.  Risks including but not limited to the risk of anesthesia, blood clots, nerve damage, blood vessel damage, failure of the prosthesis, infection and up to and including death.  Patient understand the risks, benefits and expectations and wishes to proceed with surgery.   PCP: Simone CuriaLEE,KEUNG, MD  D/C Plans:      Home  Post-op Meds:       No Rx given  Tranexamic Acid:      To be given - topically  (previous PE)   Decadron:      Is to be given  FYI:     Coumadin with Lovenox  Oxycodone  Dilaudid ok per patient  HHRN - INR and Coumadin checks    Patient Active Problem List   Diagnosis Date Noted  . OBESITY, NOS 02/20/2007  . DEPRESSION, MAJOR, RECURRENT 02/20/2007  . RESTLESS LEGS SYNDROME 02/20/2007  . GASTROESOPHAGEAL REFLUX, NO ESOPHAGITIS 02/20/2007  . CONSTIPATION 02/20/2007  . LEG  PAIN OR KNEE PAIN 02/20/2007  . INSOMNIA NOS 02/20/2007  . TOBACCO USE, QUIT 02/20/2007   Past Medical History  Diagnosis Date  . Coronary artery disease     pt states has 1 stent placed / 4 years ago   . Presence of IVC filter   . Pulmonary embolism (HCC)   . Wears glasses   . Anxiety   . GERD (gastroesophageal reflux disease)   . Arthritis   . Sciatica   . DVT (deep venous thrombosis) (HCC)   . Dyslipidemia     Past Surgical History  Procedure Laterality Date  . Back surgery    . Abdominal hysterectomy    . Angioplasty    . Abdominal surgery    . Breast surgery    . Goiter surgery    . Knee surgery      No prescriptions prior to admission   Allergies  Allergen Reactions  . Doxycycline Other (See Comments)    unknown  . Morphine And Related Hives    Social History  Substance Use Topics  . Smoking status: Current Some Day Smoker -- 0.25 packs/day for 40 years    Types: Cigarettes  . Smokeless tobacco: Never Used  . Alcohol Use: No       Review of Systems  Constitutional: Negative.   HENT: Negative.   Eyes: Negative.   Respiratory: Negative.   Cardiovascular: Negative.   Gastrointestinal: Positive for heartburn and constipation.  Genitourinary: Negative.  Musculoskeletal: Positive for joint pain.  Skin: Negative.   Neurological: Negative.   Endo/Heme/Allergies: Negative.   Psychiatric/Behavioral: Positive for depression. The patient is nervous/anxious and has insomnia.     Objective:  Physical Exam  Constitutional: She is oriented to person, place, and time. She appears well-developed.  HENT:  Head: Normocephalic.  Eyes: Pupils are equal, round, and reactive to light.  Neck: Neck supple. No JVD present. No tracheal deviation present. No thyromegaly present.  Cardiovascular: Normal rate, regular rhythm, normal heart sounds and intact distal pulses.   Respiratory: Effort normal and breath sounds normal. No stridor. No respiratory distress. She has no  wheezes.  GI: Soft. There is no tenderness. There is no guarding.  Musculoskeletal:       Left knee: She exhibits decreased range of motion, swelling and bony tenderness. She exhibits no ecchymosis, no deformity, no laceration and no erythema. Tenderness found.  Lymphadenopathy:    She has no cervical adenopathy.  Neurological: She is alert and oriented to person, place, and time.  Skin: Skin is warm and dry.  Psychiatric: She has a normal mood and affect.    Vital signs in last 24 hours: Temp:  [98.4 F (36.9 C)] 98.4 F (36.9 C) (07/18 0913) Pulse Rate:  [83] 83 (07/18 0913) Resp:  [16] 16 (07/18 0913) BP: (147)/(11) 147/11 mmHg (07/18 0913) SpO2:  [99 %] 99 % (07/18 0913) Weight:  [93.044 kg (205 lb 2 oz)] 93.044 kg (205 lb 2 oz) (07/18 0913)    Imaging Review Plain radiographs demonstrate severe degenerative joint disease of the left knee(s).  The bone quality appears to be good for age and reported activity level.  Assessment/Plan:  End stage arthritis, left knee   The patient history, physical examination, clinical judgment of the provider and imaging studies are consistent with end stage degenerative joint disease of the left knee(s) and total knee arthroplasty is deemed medically necessary. The treatment options including medical management, injection therapy arthroscopy and arthroplasty were discussed at length. The risks and benefits of total knee arthroplasty were presented and reviewed. The risks due to aseptic loosening, infection, stiffness, patella tracking problems, thromboembolic complications and other imponderables were discussed. The patient acknowledged the explanation, agreed to proceed with the plan and consent was signed. Patient is being admitted for inpatient treatment for surgery, pain control, PT, OT, prophylactic antibiotics, VTE prophylaxis, progressive ambulation and ADL's and discharge planning. The patient is planning to be discharged  home.     [name] Aaidyn San   PA-C  07/11/2016, 7:56 AM

## 2016-07-17 ENCOUNTER — Inpatient Hospital Stay (HOSPITAL_COMMUNITY): Payer: Medicare Other | Admitting: Certified Registered"

## 2016-07-17 ENCOUNTER — Encounter (HOSPITAL_COMMUNITY)
Admission: RE | Disposition: A | Discharge status: Home Health | Payer: Self-pay | Source: Ambulatory Visit | Attending: Orthopedic Surgery

## 2016-07-17 ENCOUNTER — Encounter (HOSPITAL_COMMUNITY): Payer: Self-pay | Admitting: *Deleted

## 2016-07-17 ENCOUNTER — Inpatient Hospital Stay (HOSPITAL_COMMUNITY)
Admission: RE | Admit: 2016-07-17 | Discharge: 2016-07-19 | DRG: 470 | Disposition: A | Discharge status: Home Health | Payer: Medicare Other | Source: Ambulatory Visit | Attending: Orthopedic Surgery | Admitting: Orthopedic Surgery

## 2016-07-17 DIAGNOSIS — M25562 Pain in left knee: Secondary | ICD-10-CM | POA: Diagnosis present

## 2016-07-17 DIAGNOSIS — G2581 Restless legs syndrome: Secondary | ICD-10-CM | POA: Diagnosis present

## 2016-07-17 DIAGNOSIS — Z86718 Personal history of other venous thrombosis and embolism: Secondary | ICD-10-CM | POA: Diagnosis not present

## 2016-07-17 DIAGNOSIS — K219 Gastro-esophageal reflux disease without esophagitis: Secondary | ICD-10-CM | POA: Diagnosis not present

## 2016-07-17 DIAGNOSIS — F329 Major depressive disorder, single episode, unspecified: Secondary | ICD-10-CM | POA: Diagnosis present

## 2016-07-17 DIAGNOSIS — Z86711 Personal history of pulmonary embolism: Secondary | ICD-10-CM

## 2016-07-17 DIAGNOSIS — E669 Obesity, unspecified: Secondary | ICD-10-CM | POA: Diagnosis present

## 2016-07-17 DIAGNOSIS — Z955 Presence of coronary angioplasty implant and graft: Secondary | ICD-10-CM | POA: Diagnosis not present

## 2016-07-17 DIAGNOSIS — M1712 Unilateral primary osteoarthritis, left knee: Principal | ICD-10-CM | POA: Diagnosis present

## 2016-07-17 DIAGNOSIS — Z96652 Presence of left artificial knee joint: Secondary | ICD-10-CM

## 2016-07-17 DIAGNOSIS — I2510 Atherosclerotic heart disease of native coronary artery without angina pectoris: Secondary | ICD-10-CM | POA: Diagnosis not present

## 2016-07-17 DIAGNOSIS — E785 Hyperlipidemia, unspecified: Secondary | ICD-10-CM | POA: Diagnosis present

## 2016-07-17 DIAGNOSIS — K5900 Constipation, unspecified: Secondary | ICD-10-CM | POA: Diagnosis not present

## 2016-07-17 DIAGNOSIS — G4700 Insomnia, unspecified: Secondary | ICD-10-CM | POA: Diagnosis present

## 2016-07-17 DIAGNOSIS — R269 Unspecified abnormalities of gait and mobility: Secondary | ICD-10-CM | POA: Diagnosis not present

## 2016-07-17 DIAGNOSIS — Z6836 Body mass index (BMI) 36.0-36.9, adult: Secondary | ICD-10-CM

## 2016-07-17 DIAGNOSIS — F419 Anxiety disorder, unspecified: Secondary | ICD-10-CM | POA: Diagnosis present

## 2016-07-17 DIAGNOSIS — M179 Osteoarthritis of knee, unspecified: Secondary | ICD-10-CM | POA: Diagnosis not present

## 2016-07-17 DIAGNOSIS — F17210 Nicotine dependence, cigarettes, uncomplicated: Secondary | ICD-10-CM | POA: Diagnosis present

## 2016-07-17 DIAGNOSIS — Z96659 Presence of unspecified artificial knee joint: Secondary | ICD-10-CM

## 2016-07-17 HISTORY — PX: TOTAL KNEE ARTHROPLASTY: SHX125

## 2016-07-17 LAB — PROTIME-INR
INR: 1.13 (ref 0.00–1.49)
Prothrombin Time: 14.3 seconds (ref 11.6–15.2)

## 2016-07-17 LAB — TYPE AND SCREEN
ABO/RH(D): A POS
Antibody Screen: NEGATIVE

## 2016-07-17 SURGERY — ARTHROPLASTY, KNEE, TOTAL
Anesthesia: General | Site: Knee | Laterality: Left

## 2016-07-17 MED ORDER — DEXAMETHASONE SODIUM PHOSPHATE 10 MG/ML IJ SOLN
INTRAMUSCULAR | Status: AC
  Filled 2016-07-17: qty 1

## 2016-07-17 MED ORDER — LIDOCAINE HCL (CARDIAC) 20 MG/ML IV SOLN
INTRAVENOUS | Status: DC | PRN
  Administered 2016-07-17: 100 mg via INTRAVENOUS

## 2016-07-17 MED ORDER — HYDROMORPHONE HCL 1 MG/ML IJ SOLN
0.5000 mg | INTRAMUSCULAR | Status: DC | PRN
  Filled 2016-07-17: qty 1

## 2016-07-17 MED ORDER — FENTANYL CITRATE (PF) 100 MCG/2ML IJ SOLN
INTRAMUSCULAR | Status: AC
  Filled 2016-07-17: qty 2

## 2016-07-17 MED ORDER — BISACODYL 10 MG RE SUPP: 10.0000 mg | Freq: Every day | RECTAL | Status: DC | PRN

## 2016-07-17 MED ORDER — HYDROMORPHONE HCL 1 MG/ML IJ SOLN
0.2500 mg | INTRAMUSCULAR | Status: DC | PRN
  Administered 2016-07-17 (×2): 0.5 mg via INTRAVENOUS

## 2016-07-17 MED ORDER — DIAZEPAM 5 MG PO TABS
10.0000 mg | ORAL_TABLET | Freq: Two times a day (BID) | ORAL | Status: DC | PRN
  Administered 2016-07-18 – 2016-07-19 (×2): 10 mg via ORAL
  Filled 2016-07-17 (×2): qty 2

## 2016-07-17 MED ORDER — SUGAMMADEX SODIUM 200 MG/2ML IV SOLN
INTRAVENOUS | Status: AC
  Filled 2016-07-17: qty 2

## 2016-07-17 MED ORDER — ONDANSETRON HCL 4 MG/2ML IJ SOLN: 4.0000 mg | Freq: Once | INTRAMUSCULAR | Status: DC | PRN

## 2016-07-17 MED ORDER — HYDROMORPHONE HCL 1 MG/ML IJ SOLN
INTRAMUSCULAR | Status: AC
  Filled 2016-07-17: qty 1

## 2016-07-17 MED ORDER — ALUM & MAG HYDROXIDE-SIMETH 200-200-20 MG/5ML PO SUSP: 30.0000 mL | ORAL | Status: DC | PRN

## 2016-07-17 MED ORDER — ONDANSETRON HCL 4 MG/2ML IJ SOLN: 4.0000 mg | Freq: Four times a day (QID) | INTRAMUSCULAR | Status: DC | PRN

## 2016-07-17 MED ORDER — ACETAMINOPHEN 10 MG/ML IV SOLN
INTRAVENOUS | Status: AC
  Filled 2016-07-17: qty 100

## 2016-07-17 MED ORDER — WARFARIN SODIUM 5 MG PO TABS
7.5000 mg | ORAL_TABLET | Freq: Once | ORAL | Status: AC
  Administered 2016-07-17: 7.5 mg via ORAL
  Filled 2016-07-17: qty 1

## 2016-07-17 MED ORDER — CITALOPRAM HYDROBROMIDE 20 MG PO TABS
40.0000 mg | ORAL_TABLET | Freq: Every day | ORAL | Status: DC
  Administered 2016-07-18 – 2016-07-19 (×2): 40 mg via ORAL
  Filled 2016-07-17 (×2): qty 2

## 2016-07-17 MED ORDER — DEXAMETHASONE SODIUM PHOSPHATE 10 MG/ML IJ SOLN
INTRAMUSCULAR | Status: DC | PRN
  Administered 2016-07-17: 10 mg via INTRAVENOUS

## 2016-07-17 MED ORDER — SUCCINYLCHOLINE CHLORIDE 20 MG/ML IJ SOLN
INTRAMUSCULAR | Status: DC | PRN
  Administered 2016-07-17: 100 mg via INTRAVENOUS

## 2016-07-17 MED ORDER — KETOROLAC TROMETHAMINE 30 MG/ML IJ SOLN
INTRAMUSCULAR | Status: AC
  Filled 2016-07-17: qty 1

## 2016-07-17 MED ORDER — ACETAMINOPHEN 650 MG RE SUPP
650.0000 mg | Freq: Four times a day (QID) | RECTAL | Status: DC | PRN
Start: 2016-07-17 — End: 2016-07-19

## 2016-07-17 MED ORDER — OXYCODONE HCL 5 MG/5ML PO SOLN
5.0000 mg | Freq: Once | ORAL | Status: DC | PRN
  Filled 2016-07-17: qty 5

## 2016-07-17 MED ORDER — FERROUS SULFATE 325 (65 FE) MG PO TABS
325.0000 mg | ORAL_TABLET | Freq: Three times a day (TID) | ORAL | Status: DC
  Administered 2016-07-18 – 2016-07-19 (×4): 325 mg via ORAL
  Filled 2016-07-17 (×4): qty 1

## 2016-07-17 MED ORDER — POLYETHYLENE GLYCOL 3350 17 G PO PACK
17.0000 g | PACK | Freq: Two times a day (BID) | ORAL | Status: DC
  Administered 2016-07-17 – 2016-07-19 (×4): 17 g via ORAL
  Filled 2016-07-17 (×4): qty 1

## 2016-07-17 MED ORDER — ROCURONIUM BROMIDE 100 MG/10ML IV SOLN
INTRAVENOUS | Status: DC | PRN
  Administered 2016-07-17: 25 mg via INTRAVENOUS

## 2016-07-17 MED ORDER — MIDAZOLAM HCL 2 MG/2ML IJ SOLN
INTRAMUSCULAR | Status: AC
  Filled 2016-07-17: qty 2

## 2016-07-17 MED ORDER — OXYCODONE HCL 5 MG PO TABS: 5.0000 mg | ORAL_TABLET | Freq: Once | ORAL | Status: DC | PRN

## 2016-07-17 MED ORDER — ONDANSETRON HCL 4 MG PO TABS: 4.0000 mg | ORAL_TABLET | Freq: Four times a day (QID) | ORAL | Status: DC | PRN

## 2016-07-17 MED ORDER — CLOPIDOGREL BISULFATE 75 MG PO TABS
75.0000 mg | ORAL_TABLET | ORAL | Status: DC
  Administered 2016-07-18 – 2016-07-19 (×2): 75 mg via ORAL
  Filled 2016-07-17 (×2): qty 1

## 2016-07-17 MED ORDER — ATORVASTATIN CALCIUM 20 MG PO TABS
80.0000 mg | ORAL_TABLET | Freq: Every evening | ORAL | Status: DC
  Administered 2016-07-18: 80 mg via ORAL
  Filled 2016-07-17: qty 4

## 2016-07-17 MED ORDER — ONDANSETRON HCL 4 MG/2ML IJ SOLN
INTRAMUSCULAR | Status: DC | PRN
Start: 2016-07-17 — End: 2016-07-17
  Administered 2016-07-17: 4 mg via INTRAVENOUS

## 2016-07-17 MED ORDER — OXYCODONE HCL 5 MG PO TABS
5.0000 mg | ORAL_TABLET | ORAL | Status: DC
  Administered 2016-07-17: 10 mg via ORAL
  Administered 2016-07-18 (×2): 15 mg via ORAL
  Filled 2016-07-17 (×2): qty 3
  Filled 2016-07-17: qty 2

## 2016-07-17 MED ORDER — CEFAZOLIN SODIUM-DEXTROSE 2-4 GM/100ML-% IV SOLN
INTRAVENOUS | Status: AC
  Filled 2016-07-17: qty 100

## 2016-07-17 MED ORDER — DIPHENHYDRAMINE HCL 25 MG PO CAPS
25.0000 mg | ORAL_CAPSULE | Freq: Four times a day (QID) | ORAL | Status: DC | PRN
  Administered 2016-07-19: 25 mg via ORAL
  Filled 2016-07-17: qty 1

## 2016-07-17 MED ORDER — METOCLOPRAMIDE HCL 5 MG PO TABS: 5.0000 mg | ORAL_TABLET | Freq: Three times a day (TID) | ORAL | Status: DC | PRN

## 2016-07-17 MED ORDER — ONDANSETRON HCL 4 MG/2ML IJ SOLN
INTRAMUSCULAR | Status: AC
  Filled 2016-07-17: qty 2

## 2016-07-17 MED ORDER — 0.9 % SODIUM CHLORIDE (POUR BTL) OPTIME
TOPICAL | Status: DC | PRN
  Administered 2016-07-17: 1000 mL

## 2016-07-17 MED ORDER — SUGAMMADEX SODIUM 200 MG/2ML IV SOLN
INTRAVENOUS | Status: DC | PRN
  Administered 2016-07-17: 200 mg via INTRAVENOUS

## 2016-07-17 MED ORDER — CEFAZOLIN SODIUM-DEXTROSE 2-4 GM/100ML-% IV SOLN
2.0000 g | Freq: Four times a day (QID) | INTRAVENOUS | Status: AC
  Administered 2016-07-17 – 2016-07-18 (×2): 2 g via INTRAVENOUS
  Filled 2016-07-17 (×2): qty 100

## 2016-07-17 MED ORDER — FENTANYL CITRATE (PF) 100 MCG/2ML IJ SOLN
INTRAMUSCULAR | Status: DC | PRN
  Administered 2016-07-17 (×3): 50 ug via INTRAVENOUS
  Administered 2016-07-17: 100 ug via INTRAVENOUS
  Administered 2016-07-17: 50 ug via INTRAVENOUS
  Administered 2016-07-17: 100 ug via INTRAVENOUS
  Administered 2016-07-17: 50 ug via INTRAVENOUS

## 2016-07-17 MED ORDER — LACTATED RINGERS IV SOLN
INTRAVENOUS | Status: DC
  Administered 2016-07-17: 1000 mL via INTRAVENOUS
  Administered 2016-07-17: 17:00:00 via INTRAVENOUS

## 2016-07-17 MED ORDER — MAGNESIUM CITRATE PO SOLN: 1.0000 | Freq: Once | ORAL | Status: DC | PRN

## 2016-07-17 MED ORDER — HYDROMORPHONE HCL 1 MG/ML IJ SOLN
INTRAMUSCULAR | Status: DC | PRN
  Administered 2016-07-17 (×2): 0.5 mg via INTRAVENOUS

## 2016-07-17 MED ORDER — LIDOCAINE HCL (CARDIAC) 20 MG/ML IV SOLN
INTRAVENOUS | Status: AC
  Filled 2016-07-17: qty 5

## 2016-07-17 MED ORDER — DEXMEDETOMIDINE BOLUS VIA INFUSION
0.7000 ug/kg | Freq: Once | INTRAVENOUS | Status: AC
  Administered 2016-07-17: 40 ug via INTRAVENOUS
  Filled 2016-07-17: qty 66

## 2016-07-17 MED ORDER — ACETAMINOPHEN 325 MG PO TABS
650.0000 mg | ORAL_TABLET | Freq: Four times a day (QID) | ORAL | Status: DC | PRN
  Administered 2016-07-18: 650 mg via ORAL
  Filled 2016-07-17: qty 2

## 2016-07-17 MED ORDER — ACETAMINOPHEN 10 MG/ML IV SOLN
INTRAVENOUS | Status: DC | PRN
  Administered 2016-07-17: 1000 mg via INTRAVENOUS

## 2016-07-17 MED ORDER — METOCLOPRAMIDE HCL 5 MG/ML IJ SOLN
5.0000 mg | Freq: Three times a day (TID) | INTRAMUSCULAR | Status: DC | PRN
Start: 2016-07-17 — End: 2016-07-19

## 2016-07-17 MED ORDER — TRANEXAMIC ACID 1000 MG/10ML IV SOLN
INTRAVENOUS | Status: DC | PRN
  Administered 2016-07-17: 2000 mg via TOPICAL

## 2016-07-17 MED ORDER — ESMOLOL HCL 100 MG/10ML IV SOLN
INTRAVENOUS | Status: DC | PRN
  Administered 2016-07-17: 20 mg via INTRAVENOUS

## 2016-07-17 MED ORDER — WARFARIN - PHARMACIST DOSING INPATIENT: Freq: Every day | Status: DC

## 2016-07-17 MED ORDER — ENOXAPARIN SODIUM 40 MG/0.4ML ~~LOC~~ SOLN
40.0000 mg | SUBCUTANEOUS | Status: DC
  Administered 2016-07-18 – 2016-07-19 (×2): 40 mg via SUBCUTANEOUS
  Filled 2016-07-17 (×2): qty 0.4

## 2016-07-17 MED ORDER — DOCUSATE SODIUM 100 MG PO CAPS
100.0000 mg | ORAL_CAPSULE | Freq: Two times a day (BID) | ORAL | Status: DC
  Administered 2016-07-17 – 2016-07-19 (×4): 100 mg via ORAL
  Filled 2016-07-17 (×4): qty 1

## 2016-07-17 MED ORDER — DEXAMETHASONE SODIUM PHOSPHATE 10 MG/ML IJ SOLN
10.0000 mg | Freq: Once | INTRAMUSCULAR | Status: AC
  Administered 2016-07-18: 10 mg via INTRAVENOUS
  Filled 2016-07-17: qty 1

## 2016-07-17 MED ORDER — MENTHOL 3 MG MT LOZG: 1.0000 | LOZENGE | OROMUCOSAL | Status: DC | PRN

## 2016-07-17 MED ORDER — SODIUM CHLORIDE 0.9 % IJ SOLN
INTRAMUSCULAR | Status: DC | PRN
  Administered 2016-07-17: 30 mL

## 2016-07-17 MED ORDER — BUPIVACAINE-EPINEPHRINE 0.25% -1:200000 IJ SOLN
INTRAMUSCULAR | Status: AC
  Filled 2016-07-17: qty 1

## 2016-07-17 MED ORDER — CELECOXIB 200 MG PO CAPS
200.0000 mg | ORAL_CAPSULE | Freq: Two times a day (BID) | ORAL | Status: DC
  Administered 2016-07-18 – 2016-07-19 (×3): 200 mg via ORAL
  Filled 2016-07-17 (×3): qty 1

## 2016-07-17 MED ORDER — LABETALOL HCL 5 MG/ML IV SOLN
INTRAVENOUS | Status: DC | PRN
  Administered 2016-07-17: 2.5 mg via INTRAVENOUS

## 2016-07-17 MED ORDER — KETOROLAC TROMETHAMINE 30 MG/ML IJ SOLN
INTRAMUSCULAR | Status: DC | PRN
  Administered 2016-07-17: 30 mg

## 2016-07-17 MED ORDER — SODIUM CHLORIDE 0.9 % IV SOLN
INTRAVENOUS | Status: DC
  Administered 2016-07-17 – 2016-07-18 (×2): via INTRAVENOUS

## 2016-07-17 MED ORDER — PHENOL 1.4 % MT LIQD
1.0000 | OROMUCOSAL | Status: DC | PRN
  Filled 2016-07-17: qty 177

## 2016-07-17 MED ORDER — SODIUM CHLORIDE 0.9 % IJ SOLN
INTRAMUSCULAR | Status: AC
  Filled 2016-07-17: qty 50

## 2016-07-17 MED ORDER — HYDROMORPHONE HCL 2 MG/ML IJ SOLN
INTRAMUSCULAR | Status: AC
  Filled 2016-07-17: qty 1

## 2016-07-17 MED ORDER — GABAPENTIN 400 MG PO CAPS
800.0000 mg | ORAL_CAPSULE | Freq: Two times a day (BID) | ORAL | Status: DC
  Administered 2016-07-17 – 2016-07-19 (×4): 800 mg via ORAL
  Filled 2016-07-17 (×7): qty 2

## 2016-07-17 MED ORDER — CEFAZOLIN SODIUM-DEXTROSE 2-4 GM/100ML-% IV SOLN
2.0000 g | INTRAVENOUS | Status: AC
  Administered 2016-07-17: 2 g via INTRAVENOUS

## 2016-07-17 MED ORDER — ESMOLOL HCL 100 MG/10ML IV SOLN
INTRAVENOUS | Status: AC
  Filled 2016-07-17: qty 10

## 2016-07-17 MED ORDER — FENTANYL CITRATE (PF) 250 MCG/5ML IJ SOLN
INTRAMUSCULAR | Status: AC
  Filled 2016-07-17: qty 5

## 2016-07-17 MED ORDER — TRANEXAMIC ACID 1000 MG/10ML IV SOLN
2000.0000 mg | Freq: Once | INTRAVENOUS | Status: DC
  Filled 2016-07-17: qty 20

## 2016-07-17 MED ORDER — PANTOPRAZOLE SODIUM 40 MG PO TBEC
80.0000 mg | DELAYED_RELEASE_TABLET | Freq: Every day | ORAL | Status: DC
  Administered 2016-07-18 – 2016-07-19 (×2): 80 mg via ORAL
  Filled 2016-07-17 (×2): qty 2

## 2016-07-17 MED ORDER — METHOCARBAMOL 500 MG PO TABS
500.0000 mg | ORAL_TABLET | Freq: Four times a day (QID) | ORAL | Status: DC | PRN
  Administered 2016-07-18 – 2016-07-19 (×4): 500 mg via ORAL
  Filled 2016-07-17 (×4): qty 1

## 2016-07-17 MED ORDER — PROPOFOL 10 MG/ML IV BOLUS
INTRAVENOUS | Status: DC | PRN
  Administered 2016-07-17: 200 mg via INTRAVENOUS

## 2016-07-17 MED ORDER — MIDAZOLAM HCL 5 MG/5ML IJ SOLN
INTRAMUSCULAR | Status: DC | PRN
  Administered 2016-07-17 (×2): 1 mg via INTRAVENOUS

## 2016-07-17 MED ORDER — FENTANYL CITRATE (PF) 100 MCG/2ML IJ SOLN
25.0000 ug | INTRAMUSCULAR | Status: DC | PRN
  Administered 2016-07-17 (×3): 50 ug via INTRAVENOUS

## 2016-07-17 MED ORDER — LABETALOL HCL 5 MG/ML IV SOLN
INTRAVENOUS | Status: AC
  Filled 2016-07-17: qty 4

## 2016-07-17 MED ORDER — METHOCARBAMOL 1000 MG/10ML IJ SOLN
500.0000 mg | Freq: Four times a day (QID) | INTRAVENOUS | Status: DC | PRN
  Administered 2016-07-17: 500 mg via INTRAVENOUS
  Filled 2016-07-17: qty 550
  Filled 2016-07-17 (×2): qty 5

## 2016-07-17 MED ORDER — BUPIVACAINE-EPINEPHRINE (PF) 0.25% -1:200000 IJ SOLN
INTRAMUSCULAR | Status: DC | PRN
  Administered 2016-07-17: 30 mL

## 2016-07-17 SURGICAL SUPPLY — 45 items
BAG DECANTER FOR FLEXI CONT (MISCELLANEOUS) ×2 IMPLANT
BAG SPEC THK2 15X12 ZIP CLS (MISCELLANEOUS)
BAG ZIPLOCK 12X15 (MISCELLANEOUS) IMPLANT
BANDAGE ACE 6X5 VEL STRL LF (GAUZE/BANDAGES/DRESSINGS) ×3 IMPLANT
BLADE SAW SGTL 13.0X1.19X90.0M (BLADE) ×3 IMPLANT
BOWL SMART MIX CTS (DISPOSABLE) ×3 IMPLANT
CAPT KNEE TOTAL 3 ATTUNE ×2 IMPLANT
CEMENT HV SMART SET (Cement) ×4 IMPLANT
CLOTH BEACON ORANGE TIMEOUT ST (SAFETY) ×3 IMPLANT
CUFF TOURN SGL QUICK 34 (TOURNIQUET CUFF) ×3
CUFF TRNQT CYL 34X4X40X1 (TOURNIQUET CUFF) ×1 IMPLANT
DECANTER SPIKE VIAL GLASS SM (MISCELLANEOUS) ×5 IMPLANT
DRAPE U-SHAPE 47X51 STRL (DRAPES) ×3 IMPLANT
DRESSING AQUACEL AG SP 3.5X10 (GAUZE/BANDAGES/DRESSINGS) ×1 IMPLANT
DRSG AQUACEL AG SP 3.5X10 (GAUZE/BANDAGES/DRESSINGS) ×3
DURAPREP 26ML APPLICATOR (WOUND CARE) ×6 IMPLANT
ELECT REM PT RETURN 9FT ADLT (ELECTROSURGICAL) ×3
ELECTRODE REM PT RTRN 9FT ADLT (ELECTROSURGICAL) ×1 IMPLANT
GLOVE BIOGEL M 7.0 STRL (GLOVE) IMPLANT
GLOVE BIOGEL PI IND STRL 7.5 (GLOVE) ×1 IMPLANT
GLOVE BIOGEL PI IND STRL 8.5 (GLOVE) ×1 IMPLANT
GLOVE BIOGEL PI INDICATOR 7.5 (GLOVE) ×2
GLOVE BIOGEL PI INDICATOR 8.5 (GLOVE) ×2
GLOVE ECLIPSE 8.0 STRL XLNG CF (GLOVE) ×7 IMPLANT
GLOVE ORTHO TXT STRL SZ7.5 (GLOVE) ×6 IMPLANT
GOWN STRL REUS W/TWL LRG LVL3 (GOWN DISPOSABLE) ×3 IMPLANT
GOWN STRL REUS W/TWL XL LVL3 (GOWN DISPOSABLE) ×3 IMPLANT
HANDPIECE INTERPULSE COAX TIP (DISPOSABLE) ×3
LIQUID BAND (GAUZE/BANDAGES/DRESSINGS) ×3 IMPLANT
MANIFOLD NEPTUNE II (INSTRUMENTS) ×3 IMPLANT
PACK TOTAL KNEE CUSTOM (KITS) ×3 IMPLANT
POSITIONER SURGICAL ARM (MISCELLANEOUS) ×3 IMPLANT
SET HNDPC FAN SPRY TIP SCT (DISPOSABLE) ×1 IMPLANT
SET PAD KNEE POSITIONER (MISCELLANEOUS) ×3 IMPLANT
SUT MNCRL AB 4-0 PS2 18 (SUTURE) ×3 IMPLANT
SUT VIC AB 1 CT1 36 (SUTURE) ×3 IMPLANT
SUT VIC AB 2-0 CT1 27 (SUTURE) ×9
SUT VIC AB 2-0 CT1 TAPERPNT 27 (SUTURE) ×3 IMPLANT
SUT VLOC 180 0 24IN GS25 (SUTURE) ×3 IMPLANT
SYR 50ML LL SCALE MARK (SYRINGE) ×3 IMPLANT
TRAY FOLEY W/METER SILVER 14FR (SET/KITS/TRAYS/PACK) ×3 IMPLANT
TRAY FOLEY W/METER SILVER 16FR (SET/KITS/TRAYS/PACK) ×1 IMPLANT
WATER STERILE IRR 1500ML POUR (IV SOLUTION) ×3 IMPLANT
WRAP KNEE MAXI GEL POST OP (GAUZE/BANDAGES/DRESSINGS) ×3 IMPLANT
YANKAUER SUCT BULB TIP 10FT TU (MISCELLANEOUS) ×3 IMPLANT

## 2016-07-17 NOTE — Transfer of Care (Signed)
Immediate Anesthesia Transfer of Care Note  Patient: Laura Hodges  Procedure(s) Performed: Procedure(s): TOTAL KNEE ARTHROPLASTY (Left)  Patient Location: PACU  Anesthesia Type:General  Level of Consciousness:  sedated, patient cooperative and responds to stimulation  Airway & Oxygen Therapy:Patient Spontanous Breathing and Patient connected to face mask oxgen  Post-op Assessment:  Report given to PACU RN and Post -op Vital signs reviewed and stable  Post vital signs:  Reviewed and stable  Last Vitals:  Vitals:   07/17/16 1302  BP: (!) 142/87  Pulse: 87  Resp: 18  Temp: 37.1 C    Complications: No apparent anesthesia complications

## 2016-07-17 NOTE — Anesthesia Procedure Notes (Signed)
Procedure Name: Intubation Date/Time: 07/17/2016 3:40 PM Performed by: Orest Dikes Pre-anesthesia Checklist: Patient identified, Emergency Drugs available, Suction available and Patient being monitored Patient Re-evaluated:Patient Re-evaluated prior to inductionOxygen Delivery Method: Circle system utilized Preoxygenation: Pre-oxygenation with 100% oxygen Intubation Type: IV induction Ventilation: Mask ventilation without difficulty Laryngoscope Size: Mac and 4 Grade View: Grade I Tube type: Oral Tube size: 7.5 mm Number of attempts: 1 Airway Equipment and Method: Stylet and Oral airway Placement Confirmation: ETT inserted through vocal cords under direct vision,  positive ETCO2 and breath sounds checked- equal and bilateral Secured at: 21 cm Tube secured with: Tape Dental Injury: Teeth and Oropharynx as per pre-operative assessment

## 2016-07-17 NOTE — Interval H&P Note (Signed)
History and Physical Interval Note:  07/17/2016 2:33 PM  Laura Hodges  has presented today for surgery, with the diagnosis of LEFT KNEE OA  The various methods of treatment have been discussed with the patient and family. After consideration of risks, benefits and other options for treatment, the patient has consented to  Procedure(s): TOTAL KNEE ARTHROPLASTY (Left) as a surgical intervention .  The patient's history has been reviewed, patient examined, no change in status, stable for surgery.  I have reviewed the patient's chart and labs.  Questions were answered to the patient's satisfaction.     [name]

## 2016-07-17 NOTE — Progress Notes (Signed)
ANTICOAGULATION CONSULT NOTE - Initial Consult  Pharmacy Consult for Warfarin Indication: pulmonary embolus  Allergies  Allergen Reactions  . Doxycycline Other (See Comments)    unknown  . Morphine And Related Hives    Patient Measurements: Height: 5\' 3"  (160 cm) Weight: 205 lb (93 kg) IBW/kg (Calculated) : 52.4  Vital Signs: Temp: 99.2 F (37.3 C) (07/25 1730) Temp Source: Oral (07/25 1302) BP: 132/82 (07/25 1750) Pulse Rate: 91 (07/25 1750)  Labs:  Recent Labs  07/17/16 1325  LABPROT 14.3  INR 1.13    Estimated Creatinine Clearance: 80 mL/min (by C-G formula based on SCr of 0.8 mg/dL).   Medical History: Past Medical History:  Diagnosis Date  . Anxiety   . Arthritis   . Coronary artery disease    pt states has 1 stent placed / 4 years ago   . DVT (deep venous thrombosis) (HCC)   . Dyslipidemia   . GERD (gastroesophageal reflux disease)   . Presence of IVC filter   . Pulmonary embolism (HCC)   . Sciatica   . Wears glasses     Assessment:  6yr female on warfarin PTA for h/o PE.  Presents today to undergo TKA.  Home warfarin regimen reported as 2.5mg  daily except 5mg  on MWF with last dose taken on 7/20.  INR today = 1.13  Pharmacy consulted to dose warfarin   Goal of Therapy:  INR 2-3   Plan:   Warfarin 7.5mg  po x 1 dose tonight  Daily PT/INR  Lovenox per MD until INR therapeutic  Adeola Dennen, Joselyn Glassman, PharmD 07/17/2016,6:15 PM

## 2016-07-17 NOTE — Anesthesia Preprocedure Evaluation (Addendum)
Anesthesia Evaluation  Patient identified by MRN, date of birth, ID band Patient awake    Reviewed: Allergy & Precautions, NPO status , Patient's Chart, lab work & pertinent test results  Airway Mallampati: II  TM Distance: >3 FB Neck ROM: Full    Dental  (+) Edentulous Upper, Dental Advisory Given   Pulmonary Current Smoker,    breath sounds clear to auscultation       Cardiovascular  Rhythm:Regular Rate:Normal     Neuro/Psych    GI/Hepatic   Endo/Other    Renal/GU      Musculoskeletal   Abdominal   Peds  Hematology   Anesthesia Other Findings   Reproductive/Obstetrics                            Anesthesia Physical Anesthesia Plan  ASA: III  Anesthesia Plan: General   Post-op Pain Management:    Induction: Intravenous  Airway Management Planned: Oral ETT  Additional Equipment:   Intra-op Plan:   Post-operative Plan:   Informed Consent:   Dental advisory given  Plan Discussed with: CRNA and Anesthesiologist  Anesthesia Plan Comments:         Anesthesia Quick Evaluation

## 2016-07-17 NOTE — Op Note (Signed)
NAME:  Laura Hodges                      MEDICAL RECORD NO.:  [id number]                             FACILITY:  [facility]      PHYSICIAN:  Madlyn Frankel. Charlann Boxer, M.D.  DATE OF BIRTH:  Sep 10, 1956      DATE OF PROCEDURE:  07/17/2016                                     OPERATIVE REPORT         PREOPERATIVE DIAGNOSIS:  Left knee osteoarthritis.      POSTOPERATIVE DIAGNOSIS:  Left knee osteoarthritis.      FINDINGS:  The patient was noted to have complete loss of cartilage and   bone-on-bone arthritis with associated osteophytes in the medial and aptellofemoral compartments of   the knee with a significant synovitis and associated effusion.      PROCEDURE:  Left total knee replacement.      COMPONENTS USED:  DePuy Attune rotating platform posterior stabilized knee   system, a size 4N femur, 3 tibia, size 6 mm PS AOX insert, and size 35 anatomic patellar   button.      SURGEON:  Madlyn Frankel. Charlann Boxer, M.D.      ASSISTANT:  Lanney Gins, PA-C.      ANESTHESIA:  General.      SPECIMENS:  None.      COMPLICATION:  None.      DRAINS:  One Hemovac.  EBL: <50cc      TOURNIQUET TIME:   Total Tourniquet Time Documented: Thigh (Left) - 30 minutes Total: Thigh (Left) - 30 minutes  .      The patient was stable to the recovery room.      INDICATION FOR PROCEDURE:  Laura Hodges is a 60 y.o. female patient of   mine.  The patient had been seen, evaluated, and treated conservatively in the   office with medication, activity modification, and injections.  The patient had   radiographic changes of bone-on-bone arthritis with endplate sclerosis and osteophytes noted.      The patient failed conservative measures including medication, injections, and activity modification, and at this point was ready for more definitive measures.   Based on the radiographic changes and failed conservative measures, the patient   decided to proceed with total knee replacement.  Risks of infection,   DVT, component  failure, need for revision surgery, postop course, and   expectations were all   discussed and reviewed.  Consent was obtained for benefit of pain   relief.      PROCEDURE IN DETAIL:  The patient was brought to the operative theater.   Once adequate anesthesia, preoperative antibiotics, 2 gm of Ance and 10 mg of Decadron administered, the patient was positioned supine with the left thigh tourniquet placed.  The  left lower extremity was prepped and draped in sterile fashion.  A time-   out was performed identifying the patient, planned procedure, and   extremity.      The left lower extremity was placed in the [facility] leg holder.  The leg was   exsanguinated, tourniquet elevated to 250 mmHg.  A midline incision was   made followed by median  parapatellar arthrotomy.  Following initial   exposure, attention was first directed to the patella.  Precut   measurement was noted to be 23 mm.  I resected down to 14 mm and used a   35 anatomic patellar button to restore patellar height as well as cover the cut   surface.      The lug holes were drilled and a metal shim was placed to protect the   patella from retractors and saw blades.      At this point, attention was now directed to the femur.  The femoral   canal was opened with a drill, irrigated to try to prevent fat emboli.  An   intramedullary rod was passed at 3 degrees valgus, 9 mm of bone was   resected off the distal femur.  Following this resection, the tibia was   subluxated anteriorly.  Using the extramedullary guide, 4 mm of bone was resected off   the proximal medial tibia.  We confirmed the gap would be   stable medially and laterally with a size 5 mm insert as well as confirmed   the cut was perpendicular in the coronal plane, checking with an alignment rod.      Once this was done, I sized the femur to be a size 4 in the anterior-   posterior dimension, chose a narrow component based on medial and   lateral dimension.  The  size 4 rotation block was then pinned in   position anterior referenced using the C-clamp to set rotation.  The   anterior, posterior, and  chamfer cuts were made without difficulty nor   notching making certain that I was along the anterior cortex to help   with flexion gap stability.      The final box cut was made off the lateral aspect of distal femur.      At this point, the tibia was sized to be a size 3, the size 3 tray was   then pinned in position through the medial third of the tubercle,   drilled, and keel punched.  Trial reduction was now carried with a 4 femur,  3 tibia, a size 5 then 6 mm insert, and the 38 patella botton.  The knee was brought to   extension, full extension with good flexion stability with the patella   tracking through the trochlea without application of pressure.  Given   all these findings the femoral lug holes were drilled and then the trial components removed.  Final components were   opened and cement was mixed.  The knee was irrigated with normal saline   solution and pulse lavage.  The synovial lining was   then injected with 30 cc of 0.25% Marcaine with epinephrine and 1 cc of Toradol plus 30 cc of NS for a total of 61 cc.      The knee was irrigated.  Final implants were then cemented onto clean and   dried cut surfaces of bone with the knee brought to extension with a size 6 mm trial insert.      Once the cement had fully cured, the excess cement was removed   throughout the knee.  I confirmed I was satisfied with the range of   motion and stability, and the final size 6 mm PS AOX insert was chosen.  It was   placed into the knee.      The tourniquet had been let down at 30 minutes.  No significant  hemostasis required.  The   extensor mechanism was then reapproximated using #1 Vicryl and #0 V-lock sutures with the knee   in flexion.  The   remaining wound was closed with 2-0 Vicryl and running 4-0 Monocryl.   The knee was cleaned, dried,  dressed sterilely using Dermabond and   Aquacel dressing.  The patient was then   brought to recovery room in stable condition, tolerating the procedure   well.   Please note that Physician Assistant, Lanney Gins, PA-C, was present for the entirety of the case, and was utilized for pre-operative positioning, peri-operative retractor management, general facilitation of the procedure.  He was also utilized for primary wound closure at the end of the case.              Madlyn Frankel Charlann Boxer, M.D.    07/17/2016 6:15 PM

## 2016-07-17 NOTE — Anesthesia Postprocedure Evaluation (Signed)
Anesthesia Post Note  Patient: Laura Hodges  Procedure(s) Performed: Procedure(s) (LRB): TOTAL KNEE ARTHROPLASTY (Left)  Patient location during evaluation: PACU Anesthesia Type: General Level of consciousness: awake, awake and alert and oriented Pain management: pain level controlled Vital Signs Assessment: post-procedure vital signs reviewed and stable Respiratory status: spontaneous breathing, nonlabored ventilation and respiratory function stable Cardiovascular status: blood pressure returned to baseline Anesthetic complications: no    Last Vitals:  Vitals:   07/17/16 1745 07/17/16 1750  BP:  132/82  Pulse:  91  Resp: 10 10  Temp:      Last Pain:  Vitals:   07/17/16 1830  TempSrc:   PainSc: 9                  Jeffery Bachmeier COKER

## 2016-07-18 ENCOUNTER — Encounter (HOSPITAL_COMMUNITY): Payer: Self-pay | Admitting: Orthopedic Surgery

## 2016-07-18 LAB — CBC
HEMATOCRIT: 28 % — AB (ref 36.0–46.0)
HEMOGLOBIN: 7.9 g/dL — AB (ref 12.0–15.0)
MCH: 22.5 pg — ABNORMAL LOW (ref 26.0–34.0)
MCHC: 28.2 g/dL — ABNORMAL LOW (ref 30.0–36.0)
MCV: 79.8 fL (ref 78.0–100.0)
Platelets: 356 10*3/uL (ref 150–400)
RBC: 3.51 MIL/uL — ABNORMAL LOW (ref 3.87–5.11)
RDW: 18.1 % — AB (ref 11.5–15.5)
WBC: 12.7 10*3/uL — AB (ref 4.0–10.5)

## 2016-07-18 LAB — PROTIME-INR
INR: 1.2
PROTHROMBIN TIME: 14.9 s (ref 11.4–15.2)

## 2016-07-18 LAB — BASIC METABOLIC PANEL
ANION GAP: 6 (ref 5–15)
BUN: 12 mg/dL (ref 6–20)
CALCIUM: 8.6 mg/dL — AB (ref 8.9–10.3)
CHLORIDE: 106 mmol/L (ref 101–111)
CO2: 24 mmol/L (ref 22–32)
Creatinine, Ser: 0.77 mg/dL (ref 0.44–1.00)
GFR calc Af Amer: >60 mL/min (ref >60)
GFR calc non Af Amer: >60 mL/min (ref >60)
GLUCOSE: 136 mg/dL — AB (ref 65–99)
POTASSIUM: 4.5 mmol/L (ref 3.5–5.1)
Sodium: 136 mmol/L (ref 135–145)

## 2016-07-18 MED ORDER — OXYCODONE HCL 5 MG PO TABS
10.0000 mg | ORAL_TABLET | ORAL | Status: DC
  Administered 2016-07-18 – 2016-07-19 (×7): 20 mg via ORAL
  Filled 2016-07-18 (×7): qty 4

## 2016-07-18 MED ORDER — OXYCODONE HCL 10 MG PO TABS: 10.0000 mg | ORAL_TABLET | ORAL | 0 refills | Status: DC

## 2016-07-18 MED ORDER — ACETAMINOPHEN 325 MG PO TABS: 650.0000 mg | ORAL_TABLET | Freq: Four times a day (QID) | ORAL | Status: AC | PRN

## 2016-07-18 MED ORDER — WARFARIN SODIUM 5 MG PO TABS: 7.5000 mg | ORAL_TABLET | Freq: Once | ORAL | Status: DC

## 2016-07-18 MED ORDER — ENOXAPARIN SODIUM 40 MG/0.4ML ~~LOC~~ SOLN: 40.0000 mg | SUBCUTANEOUS | 0 refills | Status: DC

## 2016-07-18 MED ORDER — NICOTINE 21 MG/24HR TD PT24
21.0000 mg | MEDICATED_PATCH | Freq: Every day | TRANSDERMAL | Status: DC
  Administered 2016-07-18 – 2016-07-19 (×2): 21 mg via TRANSDERMAL
  Filled 2016-07-18 (×2): qty 1

## 2016-07-18 MED ORDER — POLYETHYLENE GLYCOL 3350 17 G PO PACK: 17.0000 g | PACK | Freq: Two times a day (BID) | ORAL | 0 refills | Status: AC

## 2016-07-18 MED ORDER — METHOCARBAMOL 500 MG PO TABS: 500.0000 mg | ORAL_TABLET | Freq: Four times a day (QID) | ORAL | 0 refills | Status: DC | PRN

## 2016-07-18 MED ORDER — FERROUS SULFATE 325 (65 FE) MG PO TABS: 325.0000 mg | ORAL_TABLET | Freq: Three times a day (TID) | ORAL | 3 refills | Status: AC

## 2016-07-18 MED ORDER — WARFARIN SODIUM 5 MG PO TABS
7.5000 mg | ORAL_TABLET | Freq: Once | ORAL | Status: AC
  Administered 2016-07-18: 7.5 mg via ORAL
  Filled 2016-07-18: qty 1

## 2016-07-18 MED ORDER — DOCUSATE SODIUM 100 MG PO CAPS: 100.0000 mg | ORAL_CAPSULE | Freq: Two times a day (BID) | ORAL | 0 refills | Status: DC

## 2016-07-18 NOTE — Progress Notes (Addendum)
ANTICOAGULATION CONSULT NOT  Pharmacy Consult for Warfarin Indication: pulmonary embolus  Allergies  Allergen Reactions  . Doxycycline Other (See Comments)    unknown  . Morphine And Related Hives    Patient Measurements: Height: 5\' 3"  (160 cm) Weight: 205 lb (93 kg) IBW/kg (Calculated) : 52.4  Vital Signs: Temp: 99.1 F (37.3 C) (07/26 0930) Temp Source: Oral (07/26 0930) BP: 116/77 (07/26 0930) Pulse Rate: 87 (07/26 0930)  Labs:  Recent Labs  07/17/16 1325 07/18/16 0432  HGB  --  7.9*  HCT  --  28.0*  PLT  --  356  LABPROT 14.3 14.9  INR 1.13 1.20  CREATININE  --  0.77    Estimated Creatinine Clearance: 80 mL/min (by C-G formula based on SCr of 0.8 mg/dL).  Assessment:  51yr female on warfarin PTA for h/o PE.  Presents today to undergo TKA.  Home warfarin regimen reported as 2.5 mg daily except 5mg  on MWF with last dose taken on 7/20.  INR today = 1.13  Pharmacy consulted to dose warfarin   Today, 07/18/2016:  Hgb low postop as expected but overall minimal change from baseline; Plt wnl  INR remains subtherapeutic after boosted dose x 1  Patient is on Celebrex and Plavix inpatient; on Plavix/ASA + warfarin outpatient, managed by PCP  Goal of Therapy:  INR 2-3   Plan:   Warfarin 7.5 mg po x 1 dose again tonight  Daily PT/INR  Lovenox per MD until INR >/= 1.8  Bernadene Person, PharmD, BCPS Pager: [phone] 07/18/2016, 10:35 AM

## 2016-07-18 NOTE — Progress Notes (Signed)
OT Cancellation Note  Patient Details Name: TAURIE GREENSLADE MRN: [id number] DOB: [birth year]   Cancelled Treatment:    Reason Eval/Treat Not Completed: Pain limiting ability to participate;Fatigue/lethargy limiting ability to participate  Will check onpt later in day or next day  Lise Auer, [place] [phone]  Einar Crow D 07/18/2016, 11:40 AM

## 2016-07-18 NOTE — Care Management Note (Signed)
Case Management Note  Patient Details  Name: Laura Hodges MRN: [id number] Date of Birth: [birth year]  Subjective/Objective:                  TOTAL KNEE ARTHROPLASTY (Left) Action/Plan: Discharge planning Expected Discharge Date:   (uncertain at this time)               Expected Discharge Plan:  [place]  In-House Referral:     Discharge planning Services  CM Consult  Post Acute Care Choice:  Home Health Choice offered to:  Patient  DME Arranged:  3-N-1 DME Agency:  [name]:  PT [place]:  [facility] (now [facility])  Status of Service:  Completed, signed off  If discussed at [employer] of Stay Meetings, dates discussed:    Additional Comments: CM met with pt in room to offer choice of home health agency.  Pt chooses [facility] to render HHPT.  Referral given to [employer], Tim.  CM called [facility] DME rep, Jermaine to please deliver the rolling walker and 3n1 to room prior to discharge.  No other CM needs were communicated. Dellie Catholic, RN 07/18/2016, 12:33 PM

## 2016-07-18 NOTE — Progress Notes (Signed)
     Subjective: 1 Day Post-Op Procedure(s) (LRB): TOTAL KNEE ARTHROPLASTY (Left)   Patient reports pain as moderate, pain not fully controlled.  Otherwise no events throughout the night.   Objective:   VITALS:   Vitals:   07/18/16 0620 07/18/16 0649  BP:  132/81  Pulse: 90 82  Resp: 16 15  Temp:  99 F (37.2 C)    Dorsiflexion/Plantar flexion intact Incision: dressing C/D/I No cellulitis present Compartment soft  LABS  Recent Labs  07/18/16 0432  HGB 7.9*  HCT 28.0*  WBC 12.7*  PLT 356     Recent Labs  07/18/16 0432  NA 136  K 4.5  BUN 12  CREATININE 0.77  GLUCOSE 136*     Assessment/Plan: 1 Day Post-Op Procedure(s) (LRB): TOTAL KNEE ARTHROPLASTY (Left) Foley cath d/c'ed Advance diet Up with therapy D/C IV fluids Discharge home with home health, eventually, when ready  Obese (BMI 30-39.9) Estimated body mass index is 36.31 kg/m as calculated from the following:   Height as of this encounter: 5\' 3"  (1.6 m).   Weight as of this encounter: 93 kg (205 lb). Patient also counseled that weight may inhibit the healing process Patient counseled that losing weight will help with future health issues      Laura Hodges. Laura Hodges   PAC  07/18/2016, 8:39 AM

## 2016-07-18 NOTE — Discharge Planning (Signed)
Patient has been educated on [facility] discharge an start of care process.  She has a new primary contact number of [phone].

## 2016-07-18 NOTE — Progress Notes (Signed)
Physical Therapy Treatment Patient Details Name: Laura Hodges MRN: [id number] DOB: 28-Apr-1956 Today's Date: August 10, 2016    History of Present Illness Pt s/p L TKR.  Pt wtih hx of CAD, back surgery, and anxiety    PT Comments    Steady progress with mobility.  Follow Up Recommendations  SNF     Equipment Recommendations  Rolling walker with 5" wheels    Recommendations for Other Services OT consult     Precautions / Restrictions Precautions Precautions: Fall;Knee Restrictions Weight Bearing Restrictions: No LLE Weight Bearing: Weight bearing as tolerated    Mobility  Bed Mobility Overal bed mobility: Needs Assistance Bed Mobility: Supine to Sit;Sit to Supine     Supine to sit: Min assist Sit to supine: Min assist   General bed mobility comments: Cues for sequence and assist to exit/enter bed using log roll technique 2* back issues  Transfers Overall transfer level: Needs assistance Equipment used: Rolling walker (2 wheeled) Transfers: Sit to/from Stand Sit to Stand: Min assist;From elevated surface         General transfer comment: cues for LE management and use of UEs to self assist  Ambulation/Gait Ambulation/Gait assistance: Min assist Ambulation Distance (Feet): 63 Feet Assistive device: Rolling walker (2 wheeled) Gait Pattern/deviations: Step-to pattern;Decreased step length - right;Decreased step length - left;Shuffle;Trunk flexed Gait velocity: decr Gait velocity interpretation: Below normal speed for age/gender General Gait Details: cues for sequence, posture and position from [employer]            Wheelchair Mobility    Modified Rankin (Stroke Patients Only)       Balance                                    Cognition Arousal/Alertness: Awake/alert Behavior During Therapy: WFL for tasks assessed/performed Overall Cognitive Status: Within Functional Limits for tasks assessed                       Exercises      General Comments        Pertinent Vitals/Pain Pain Assessment: 0-10 Pain Score: 6  Pain Location: L knee Pain Descriptors / Indicators: Aching;Sore Pain Intervention(s): Limited activity within patient's tolerance;Monitored during session;Premedicated before session    Home Living                      Prior Function            PT Goals (current goals can now be found in the care plan section) Acute Rehab PT Goals Patient Stated Goal: Rehab prior to return home 2* ltd assist at home PT Goal Formulation: With patient Time For Goal Achievement: 07/23/16 Potential to Achieve Goals: Good Progress towards PT goals: Progressing toward goals    Frequency  7X/week    PT Plan Current plan remains appropriate    Co-evaluation             End of Session Equipment Utilized During Treatment: Gait belt Activity Tolerance: Patient limited by fatigue;Patient limited by pain Patient left: in bed;with call bell/phone within reach     Time: 1421-1457 PT Time Calculation (min) (ACUTE ONLY): 36 min  Charges:  $Gait Training: 23-37 mins                    G Codes:      Seini Lannom 08-10-2016, 3:07 PM

## 2016-07-18 NOTE — Evaluation (Signed)
Physical Therapy Evaluation Patient Details Name: Laura Hodges MRN: [id number] DOB: 1956/03/23 Today's Date: 07/18/2016   History of Present Illness  Pt s/p L TKR.  Pt wtih hx of CAD, back surgery, and anxiety  Clinical Impression  Pt s/p L TKR presents with decreased L LE strength/ROM and post op pain limiting functional mobility.  Pt would benefit from follow up rehab at SNF level to maximize IND and safety prior to return home with limited assist.    Follow Up Recommendations SNF    Equipment Recommendations  Rolling walker with 5" wheels    Recommendations for Other Services OT consult     Precautions / Restrictions Precautions Precautions: Fall;Knee Restrictions Weight Bearing Restrictions: No LLE Weight Bearing: Weight bearing as tolerated      Mobility  Bed Mobility Overal bed mobility: Needs Assistance Bed Mobility: Supine to Sit     Supine to sit: Mod assist     General bed mobility comments: Cues for sequence and assist to exit bed using log roll technique 2* back issues  Transfers Overall transfer level: Needs assistance Equipment used: Rolling walker (2 wheeled) Transfers: Sit to/from Stand Sit to Stand: Min assist;Mod assist;From elevated surface         General transfer comment: cues for LE management and use of UEs to self assist  Ambulation/Gait Ambulation/Gait assistance: Min assist;Mod assist Ambulation Distance (Feet): 13 Feet Assistive device: Rolling walker (2 wheeled) Gait Pattern/deviations: Step-to pattern;Decreased step length - right;Decreased step length - left;Shuffle;Trunk flexed Gait velocity: decr Gait velocity interpretation: Below normal speed for age/gender General Gait Details: cues for sequence, posture and position from [employer]            Wheelchair Mobility    Modified Rankin (Stroke Patients Only)       Balance                                             Pertinent Vitals/Pain  Pain Assessment: 0-10 Pain Score: 8  Pain Location: L knee Pain Descriptors / Indicators: Aching;Sore Pain Intervention(s): Limited activity within patient's tolerance;Monitored during session;Premedicated before session;Ice applied    Home Living Family/patient expects to be discharged to:: Skilled nursing facility Living Arrangements: Children               Additional Comments: Pt with mininal assist at home    Prior Function Level of Independence: Independent with assistive device(s)         Comments: utilized cane and RW as needed for mobility     Hand Dominance        Extremity/Trunk Assessment   Upper Extremity Assessment: Generalized weakness           Lower Extremity Assessment: LLE deficits/detail   LLE Deficits / Details: 3-/5 quads with AAROM at knee -10 - 50     Communication   Communication: No difficulties  Cognition Arousal/Alertness: Awake/alert Behavior During Therapy: WFL for tasks assessed/performed Overall Cognitive Status: Within Functional Limits for tasks assessed                      General Comments      Exercises Total Joint Exercises Ankle Circles/Pumps: AROM;Both;15 reps;Supine Quad Sets: AROM;Both;10 reps;Supine Heel Slides: AAROM;Left;15 reps;Supine Straight Leg Raises: AAROM;Left;10 reps;Supine      Assessment/Plan    PT Assessment Patient needs continued PT  services  PT Diagnosis Difficulty walking   PT Problem List Decreased strength;Decreased range of motion;Decreased activity tolerance;Decreased mobility;Decreased knowledge of use of DME;Pain;Obesity  PT Treatment Interventions DME instruction;Gait training;Stair training;Functional mobility training;Therapeutic activities;Therapeutic exercise;Patient/family education   PT Goals (Current goals can be found in the Care Plan section) Acute Rehab PT Goals Patient Stated Goal: Rehab prior to return home 2* ltd assist at home PT Goal Formulation: With  patient Time For Goal Achievement: 07/23/16 Potential to Achieve Goals: Good    Frequency 7X/week   Barriers to discharge        Co-evaluation               End of Session Equipment Utilized During Treatment: Gait belt Activity Tolerance: Patient limited by fatigue;Patient limited by pain Patient left: in chair;with call bell/phone within reach;with nursing/sitter in room Nurse Communication: Mobility status         Time: 0370-4888 PT Time Calculation (min) (ACUTE ONLY): 40 min   Charges:   PT Evaluation $PT Eval Low Complexity: 1 Procedure PT Treatments $Gait Training: 8-22 mins $Therapeutic Exercise: 8-22 mins   PT G Codes:        Jailin Moomaw 08/09/2016, 12:42 PM

## 2016-07-18 NOTE — Discharge Instructions (Signed)

## 2016-07-19 LAB — CBC
HCT: 25.5 % — ABNORMAL LOW (ref 36.0–46.0)
Hemoglobin: 7.1 g/dL — ABNORMAL LOW (ref 12.0–15.0)
MCH: 22.1 pg — AB (ref 26.0–34.0)
MCHC: 27.8 g/dL — AB (ref 30.0–36.0)
MCV: 79.4 fL (ref 78.0–100.0)
PLATELETS: 330 10*3/uL (ref 150–400)
RBC: 3.21 MIL/uL — ABNORMAL LOW (ref 3.87–5.11)
RDW: 17.7 % — ABNORMAL HIGH (ref 11.5–15.5)
WBC: 13.7 10*3/uL — ABNORMAL HIGH (ref 4.0–10.5)

## 2016-07-19 LAB — BASIC METABOLIC PANEL
Anion gap: 6 (ref 5–15)
BUN: 16 mg/dL (ref 6–20)
CO2: 25 mmol/L (ref 22–32)
CREATININE: 0.74 mg/dL (ref 0.44–1.00)
Calcium: 8.8 mg/dL — ABNORMAL LOW (ref 8.9–10.3)
Chloride: 108 mmol/L (ref 101–111)
GFR calc Af Amer: >60 mL/min (ref >60)
GLUCOSE: 128 mg/dL — AB (ref 65–99)
Potassium: 4.5 mmol/L (ref 3.5–5.1)
SODIUM: 139 mmol/L (ref 135–145)

## 2016-07-19 LAB — PROTIME-INR
INR: 1.54
PROTHROMBIN TIME: 18.7 s — AB (ref 11.4–15.2)

## 2016-07-19 MED ORDER — WARFARIN SODIUM 5 MG PO TABS: 5.0000 mg | ORAL_TABLET | Freq: Once | ORAL | Status: DC

## 2016-07-19 MED ORDER — WARFARIN SODIUM 5 MG PO TABS: 5.0000 mg | ORAL_TABLET | Freq: Once | ORAL | 0 refills | Status: DC

## 2016-07-19 NOTE — Progress Notes (Signed)
Occupational Therapy Evaluation Patient Details Name: Laura Hodges MRN: [id number] DOB: May 24, 1956 Today's Date: 07/19/2016    History of Present Illness Pt s/p L TKR.  Pt wtih hx of CAD, back surgery, and anxiety   Clinical Impression   All OT education completed and pt questions answered. No further OT needs at this time; will sign off.    Follow Up Recommendations  No OT follow up;Supervision/Assistance - 24 hour    Equipment Recommendations  3 in 1 bedside comode    Recommendations for Other Services       Precautions / Restrictions Precautions Precautions: Fall;Knee Restrictions Weight Bearing Restrictions: No LLE Weight Bearing: Weight bearing as tolerated      Mobility Bed Mobility Overal bed mobility: Needs Assistance Bed Mobility: Supine to Sit;Sit to Supine     Supine to sit: Min assist Sit to supine: Min assist   General bed mobility comments: assist for LEs  Transfers Overall transfer level: Needs assistance Equipment used: Rolling walker (2 wheeled) Transfers: Sit to/from Stand Sit to Stand: Min guard              Balance                                            ADL Overall ADL's : Needs assistance/impaired Eating/Feeding: Independent;Bed level   Grooming: Wash/dry hands;Min guard;Standing           Upper Body Dressing : Set up;Sitting   Lower Body Dressing: Minimal assistance;Moderate assistance;Sit to/from stand   Toilet Transfer: Min guard;Ambulation;BSC;RW   Toileting- Architect and Hygiene: Min guard;Sit to/from Nurse, children's Details (indicate cue type and reason): verbal education on shower transfer technique; pt verbalized understanding but did not wish to practice Functional mobility during ADLs: Min guard;Rolling walker General ADL Comments: Husband will assist with BADLs as needed. Patient concerned about ability to pay for pain meds today and wanted to take pain  medication prior to d/c. Nursing made aware.     Vision     Perception     Praxis      Pertinent Vitals/Pain Pain Assessment: 0-10 Pain Score: 7  Pain Location: L knee Pain Descriptors / Indicators: Aching;Sore Pain Intervention(s): Monitored during session;Patient requesting pain meds-RN notified;Repositioned;Ice applied     Hand Dominance     Extremity/Trunk Assessment Upper Extremity Assessment Upper Extremity Assessment: Overall WFL for tasks assessed   Lower Extremity Assessment Lower Extremity Assessment: Defer to PT evaluation       Communication Communication Communication: No difficulties   Cognition Arousal/Alertness: Awake/alert Behavior During Therapy: WFL for tasks assessed/performed Overall Cognitive Status: Within Functional Limits for tasks assessed                     General Comments       Exercises       Shoulder Instructions      Home Living Family/patient expects to be discharged to:: Private residence Living Arrangements: Children Available Help at Discharge: Family               Bathroom Shower/Tub: Tub/shower unit;Walk-in shower   Bathroom Toilet: Standard Bathroom Accessibility: Yes How Accessible: Accessible via walker Home Equipment: Shower seat - built in;Walker - 2 wheels;Cane - single point          Prior Functioning/Environment Level of Independence: Independent with  assistive device(s)        Comments: utilized cane and RW as needed for mobility    OT Diagnosis: Acute pain   OT Problem List: Decreased strength;Decreased range of motion;Decreased activity tolerance;Decreased knowledge of use of DME or AE;Pain   OT Treatment/Interventions:      OT Goals(Current goals can be found in the care plan section) Acute Rehab OT Goals Patient Stated Goal: home today OT Goal Formulation: All assessment and education complete, DC therapy  OT Frequency:     Barriers to D/C:            Co-evaluation               End of Session Equipment Utilized During Treatment: Rolling walker Nurse Communication: Patient requests pain meds  Activity Tolerance: Patient tolerated treatment well Patient left: in bed;with call bell/phone within reach;with family/visitor present   Time: 1100-1114 OT Time Calculation (min): 14 min Charges:  OT General Charges $OT Visit: 1 Procedure OT Evaluation $OT Eval Low Complexity: 1 Procedure G-Codes:    Senita Corredor A 08-11-16, 11:56 AM

## 2016-07-19 NOTE — Progress Notes (Signed)
ANTICOAGULATION CONSULT NOTE  Pharmacy Consult for Warfarin Indication: History of pulmonary embolus  Allergies  Allergen Reactions  . Doxycycline Other (See Comments)    unknown  . Morphine And Related Hives    Patient Measurements: Height: 5\' 3"  (160 cm) Weight: 205 lb (93 kg) IBW/kg (Calculated) : 52.4  Vital Signs: Temp: 98.3 F (36.8 C) (07/27 0552) Temp Source: Oral (07/27 0552) BP: 130/81 (07/27 0552) Pulse Rate: 78 (07/27 0552)  Labs:  Recent Labs  07/17/16 1325 07/18/16 0432 07/19/16 0417  HGB  --  7.9* 7.1*  HCT  --  28.0* 25.5*  PLT  --  356 330  LABPROT 14.3 14.9 18.7*  INR 1.13 1.20 1.54  CREATININE  --  0.77 0.74    Estimated Creatinine Clearance: 80 mL/min (by C-G formula based on SCr of 0.8 mg/dL).  Assessment:  Laura Hodges on warfarin PTA for h/o PE.  Presents today to undergo TKA.  Home warfarin regimen reported as 2.5 mg daily except 5mg  on MWF with last dose taken on 7/20.  INR today = 1.13  Pharmacy consulted to dose warfarin   Today, 07/19/2016:  Hgb low postop; Plt wnl  INR remains subtherapeutic, but trending to goal  Patient is on Celebrex and Plavix inpatient; on Plavix/ASA + warfarin outpatient, managed by PCP  Goal of Therapy:  INR 2-3   Plan:   Warfarin 5 mg po x 1 dose tonight  Daily PT/INR  Lovenox per MD until INR >/= 1.8  Junita Push, PharmD, BCPS Pager: [phone] 07/19/2016, 8:39 AM

## 2016-07-19 NOTE — Progress Notes (Signed)
Physical Therapy Treatment Patient Details Name: Laura Hodges MRN: [id number] DOB: [birth year] Today's Date: 07/19/2016    History of Present Illness Pt s/p L TKR.  Pt wtih hx of CAD, back surgery, and anxiety    PT Comments    POD # 2 pm session Performed all supine TKR TE's following HEP handout.  Instructed on proper tech and freq as well as use of ICE.     Follow Up Recommendations  SNF     Equipment Recommendations       Recommendations for Other Services       Precautions / Restrictions Precautions Precautions: Fall Restrictions Weight Bearing Restrictions: No LLE Weight Bearing: Weight bearing as tolerated       Balance                                    Cognition Arousal/Alertness: Awake/alert Behavior During Therapy: WFL for tasks assessed/performed Overall Cognitive Status: Within Functional Limits for tasks assessed                      Exercises   Total Knee Replacement TE's 10 reps B LE ankle pumps 10 reps towel squeezes 10 reps knee presses 10 reps heel slides  10 reps SAQ's 10 reps SLR's 10 reps ABD Followed by ICE    General Comments        Pertinent Vitals/Pain Pain Assessment: 0-10 Pain Score: 5  Pain Location: L knee Pain Descriptors / Indicators: Grimacing;Sore Pain Intervention(s): Monitored during session;Repositioned;Ice applied    Home Living Family/patient expects to be discharged to:: Private residence Living Arrangements: Children Available Help at Discharge: Family         Home Equipment: Shower seat - built in;Walker - 2 wheels;Cane - single point      Prior Function Level of Independence: Independent with assistive device(s)      Comments: utilized cane and RW as needed for mobility   PT Goals (current goals can now be found in the care plan section) Acute Rehab PT Goals Patient Stated Goal: home today Progress towards PT goals: Progressing toward goals    Frequency  7X/week     PT Plan Current plan remains appropriate    Co-evaluation             End of Session Equipment Utilized During Treatment: Gait belt Activity Tolerance: Patient tolerated treatment well Patient left: in bed;with call bell/phone within reach;with bed alarm set     Time: 4818-5631 PT Time Calculation (min) (ACUTE ONLY): 14 min  Charges:  $Therapeutic Exercise: 8-22 mins                     G Codes:      Felecia Shelling  PTA [facility]  Acute  Rehab Pager      [phone]

## 2016-07-19 NOTE — Progress Notes (Signed)
Pt to d/c home with [facility] home [facility]. AVS reviewed and "My Chart" discussed with pt. Pt capable of verbalizing medications, signs and symptoms of infection, and follow-up appointments. Remains hemodynamically stable. No signs and symptoms of distress. Educated pt to return to ER in the case of SOB, dizziness, or chest pain.  

## 2016-07-19 NOTE — Progress Notes (Signed)
     Subjective: 2 Days Post-Op Procedure(s) (LRB): TOTAL KNEE ARTHROPLASTY (Left)   Patient reports pain as mild, pain controlled. No events throughout the night.  We have discussed her low H&H and she understands.  We have discussed the use of iron to help increase her hemoglobin.  She denies any symptoms of a low H&H including light headedness / dizziness.  She states that she is ready to be discharged home after working with PT and doing stair training.  Objective:   VITALS:   Vitals:   07/18/16 2258 07/19/16 0552  BP: 137/76 130/81  Pulse: 96 78  Resp:  18  Temp: 98.2 F (36.8 C) 98.3 F (36.8 C)    Dorsiflexion/Plantar flexion intact Incision: dressing C/D/I No cellulitis present Compartment soft  LABS  Recent Labs  07/18/16 0432 07/19/16 0417  HGB 7.9* 7.1*  HCT 28.0* 25.5*  WBC 12.7* 13.7*  PLT 356 330     Recent Labs  07/18/16 0432 07/19/16 0417  NA 136 139  K 4.5 4.5  BUN 12 16  CREATININE 0.77 0.74  GLUCOSE 136* 128*     Assessment/Plan: 2 Days Post-Op Procedure(s) (LRB): TOTAL KNEE ARTHROPLASTY (Left) Up with therapy Discharge home with home health  Follow up in 2 weeks at [facility]. Follow up with OLIN,Luisfernando Brightwell D in 2 weeks.  Contact information:  [facility] [address], [address] [place] [address] [phone]        [name]. Ineta Sinning   PAC  07/19/2016, 9:54 AM

## 2016-07-19 NOTE — Progress Notes (Signed)
Physical Therapy Treatment Patient Details Name: Laura Hodges MRN: [id number] DOB: 1956/03/13 Today's Date: 07/19/2016    History of Present Illness Pt s/p L TKR.  Pt wtih hx of CAD, back surgery, and anxiety    PT Comments    POD # 2 Assisted OOB to amb then practiced stairs  Follow Up Recommendations  SNF     Equipment Recommendations       Recommendations for Other Services       Precautions / Restrictions Precautions Precautions: Fall Restrictions Weight Bearing Restrictions: No LLE Weight Bearing: Weight bearing as tolerated    Mobility  Bed Mobility Overal bed mobility: Needs Assistance Bed Mobility: Supine to Sit;Sit to Supine     Supine to sit: Min guard;Min assist Sit to supine: Min assist   General bed mobility comments: assist for LEs  Transfers Overall transfer level: Needs assistance Equipment used: Rolling walker (2 wheeled) Transfers: Sit to/from Stand Sit to Stand: Supervision;Min guard         General transfer comment: cues for LE management and use of UEs to self assist  Ambulation/Gait Ambulation/Gait assistance: Supervision;Min guard Ambulation Distance (Feet): 42 Feet Assistive device: Rolling walker (2 wheeled) Gait Pattern/deviations: Step-to pattern Gait velocity: decr   General Gait Details: cues for sequence, posture and position from RW   Stairs Stairs: Yes Stairs assistance: Min guard Stair Management: One rail Right;Step to pattern;Forwards Number of Stairs: 4 General stair comments: 25% VC's on proper sequencing and use of crutch  Wheelchair Mobility    Modified Rankin (Stroke Patients Only)       Balance                                    Cognition Arousal/Alertness: Awake/alert Behavior During Therapy: WFL for tasks assessed/performed Overall Cognitive Status: Within Functional Limits for tasks assessed                      Exercises      General Comments         Pertinent Vitals/Pain Pain Assessment: 0-10 Pain Score: 5  Pain Location: L knee Pain Descriptors / Indicators: Grimacing;Sore Pain Intervention(s): Monitored during session;Repositioned;Ice applied    Home Living Family/patient expects to be discharged to:: Private residence Living Arrangements: Children Available Help at Discharge: Family         Home Equipment: Shower seat - built in;Walker - 2 wheels;Cane - single point      Prior Function Level of Independence: Independent with assistive device(s)      Comments: utilized cane and RW as needed for mobility   PT Goals (current goals can now be found in the care plan section) Acute Rehab PT Goals Patient Stated Goal: home today Progress towards PT goals: Progressing toward goals    Frequency  7X/week    PT Plan Current plan remains appropriate    Co-evaluation             End of Session Equipment Utilized During Treatment: Gait belt Activity Tolerance: Patient tolerated treatment well Patient left: in bed;with call bell/phone within reach;with bed alarm set     Time: 1478-2956 PT Time Calculation (min) (ACUTE ONLY): 29 min  Charges:  $Gait Training: 8-22 mins $Therapeutic Activity: 8-22 mins                    G Codes:      Lawson Fiscal  Jerie Basford  PTA [facility]  Acute  Rehab Pager      [phone]

## 2016-07-20 DIAGNOSIS — Z96652 Presence of left artificial knee joint: Secondary | ICD-10-CM | POA: Diagnosis not present

## 2016-07-20 DIAGNOSIS — Z7982 Long term (current) use of aspirin: Secondary | ICD-10-CM | POA: Diagnosis not present

## 2016-07-20 DIAGNOSIS — Z7901 Long term (current) use of anticoagulants: Secondary | ICD-10-CM | POA: Diagnosis not present

## 2016-07-20 DIAGNOSIS — Z86711 Personal history of pulmonary embolism: Secondary | ICD-10-CM | POA: Diagnosis not present

## 2016-07-20 DIAGNOSIS — Z5181 Encounter for therapeutic drug level monitoring: Secondary | ICD-10-CM | POA: Diagnosis not present

## 2016-07-20 DIAGNOSIS — I2510 Atherosclerotic heart disease of native coronary artery without angina pectoris: Secondary | ICD-10-CM | POA: Diagnosis not present

## 2016-07-20 DIAGNOSIS — Z4801 Encounter for change or removal of surgical wound dressing: Secondary | ICD-10-CM | POA: Diagnosis not present

## 2016-07-20 DIAGNOSIS — G2581 Restless legs syndrome: Secondary | ICD-10-CM | POA: Diagnosis not present

## 2016-07-20 DIAGNOSIS — Z79891 Long term (current) use of opiate analgesic: Secondary | ICD-10-CM | POA: Diagnosis not present

## 2016-07-20 DIAGNOSIS — Z471 Aftercare following joint replacement surgery: Secondary | ICD-10-CM | POA: Diagnosis not present

## 2016-07-21 DIAGNOSIS — Z96652 Presence of left artificial knee joint: Secondary | ICD-10-CM | POA: Diagnosis not present

## 2016-07-21 DIAGNOSIS — Z471 Aftercare following joint replacement surgery: Secondary | ICD-10-CM | POA: Diagnosis not present

## 2016-07-21 DIAGNOSIS — Z5181 Encounter for therapeutic drug level monitoring: Secondary | ICD-10-CM | POA: Diagnosis not present

## 2016-07-21 DIAGNOSIS — G2581 Restless legs syndrome: Secondary | ICD-10-CM | POA: Diagnosis not present

## 2016-07-21 DIAGNOSIS — Z7901 Long term (current) use of anticoagulants: Secondary | ICD-10-CM | POA: Diagnosis not present

## 2016-07-21 DIAGNOSIS — Z7982 Long term (current) use of aspirin: Secondary | ICD-10-CM | POA: Diagnosis not present

## 2016-07-21 DIAGNOSIS — Z79891 Long term (current) use of opiate analgesic: Secondary | ICD-10-CM | POA: Diagnosis not present

## 2016-07-21 DIAGNOSIS — Z4801 Encounter for change or removal of surgical wound dressing: Secondary | ICD-10-CM | POA: Diagnosis not present

## 2016-07-21 DIAGNOSIS — Z86711 Personal history of pulmonary embolism: Secondary | ICD-10-CM | POA: Diagnosis not present

## 2016-07-21 DIAGNOSIS — I2510 Atherosclerotic heart disease of native coronary artery without angina pectoris: Secondary | ICD-10-CM | POA: Diagnosis not present

## 2016-07-23 NOTE — Discharge Summary (Signed)
Physician Discharge Summary  Patient ID: Laura Hodges MRN: [id number] DOB/AGE: [birth year] 61 y.o.  Admit date: 07/17/2016 Discharge date: 07/19/2016   Procedures:  Procedure(s) (LRB): TOTAL KNEE ARTHROPLASTY (Left)  Attending Physician:  Dr. Durene Romans   Admission Diagnoses:   Left knee primary OA / pain  Discharge Diagnoses:  Principal Problem:   S/P left TKA Active Problems:   OBESITY, NOS  Past Medical History:  Diagnosis Date  . Anxiety   . Arthritis   . Coronary artery disease    pt states has 1 stent placed / 4 years ago   . DVT (deep venous thrombosis) (HCC)   . Dyslipidemia   . GERD (gastroesophageal reflux disease)   . Presence of IVC filter   . Pulmonary embolism (HCC)   . Sciatica   . Wears glasses     HPI:    Laura Hodges, 60 y.o. female, has a history of pain and functional disability in the left knee due to arthritis and has failed non-surgical conservative treatments for greater than 12 weeks to include NSAID's and/or analgesics, corticosteriod injections, use of assistive devices and activity modification.  Onset of symptoms was gradual, starting 2+ years ago with gradually worsening course since that time. The patient noted prior procedures on the knee to include  arthroscopy on the left knee(s).  Patient currently rates pain in the left knee(s) at 9 out of 10 with activity. Patient has night pain, worsening of pain with activity and weight bearing, pain that interferes with activities of daily living, pain with passive range of motion, crepitus and joint swelling.  Patient has evidence of periarticular osteophytes and joint space narrowing by imaging studies.  There is no active infection.   Risks, benefits and expectations were discussed with the patient.  Risks including but not limited to the risk of anesthesia, blood clots, nerve damage, blood vessel damage, failure of the prosthesis, infection and up to and including death.  Patient understand the  risks, benefits and expectations and wishes to proceed with surgery.   PCP: Laura Curia, MD   Discharged Condition: good  Hospital Course:  Patient underwent the above stated procedure on 07/17/2016. Patient tolerated the procedure well and brought to the recovery room in good condition and subsequently to the floor.  POD #1 BP: 132/81 ; Pulse: 82 ; Temp: 99 F (37.2 C) ; Resp: 15 Patient reports pain as moderate, pain not fully controlled.  Otherwise no events throughout the night.  Dorsiflexion/plantar flexion intact, incision: dressing C/D/I, no cellulitis present and compartment soft.   LABS  Basename    HGB     7.9  HCT     28.0   POD #2  BP: 130/81 ; Pulse: 78 ; Temp: 98.3 F (36.8 C) ; Resp: 18 Patient reports pain as mild, pain controlled. No events throughout the night.  We have discussed her low H&H and she understands.  We have discussed the use of iron to help increase her hemoglobin.  She denies any symptoms of a low H&H including light headedness / dizziness.  She states that she is ready to be discharged home after working with PT and doing stair training. Dorsiflexion/plantar flexion intact, incision: dressing C/D/I, no cellulitis present and compartment soft.   LABS  Basename    HGB     7.1  HCT     25.5    Discharge Exam: General appearance: alert, cooperative and no distress Extremities: Homans sign is negative, no sign of DVT,  no edema, redness or tenderness in the calves or thighs and no ulcers, gangrene or trophic changes  Disposition: Home with follow up in 2 weeks   Follow-up Information    [name], MD. Schedule an appointment as soon as possible for a visit in 2 week(s).   Specialty:  Orthopedic Surgery Contact information: [address] [place] [address] [phone]        [facility] .   Why:  home health physical therapy Contact information: [address] [place]  [phone]        Inc[facility] - [facility] .   Why:  rolling walker and 3n1 Contact information: [address] [place] [address] [phone]           Discharge Instructions    Call MD / Call 911    Complete by:  As directed   If you experience chest pain or shortness of breath, CALL 911 and be transported to the hospital emergency room.  If you develope a fever above 101 F, pus (white drainage) or increased drainage or redness at the wound, or calf pain, call your surgeon's office.   Change dressing    Complete by:  As directed   Maintain surgical dressing until follow up in the clinic. If the edges start to pull up, may reinforce with tape. If the dressing is no longer working, may remove and cover with gauze and tape, but must keep the area dry and clean.  Call with any questions or concerns.   Constipation Prevention    Complete by:  As directed   Drink plenty of fluids.  Prune juice may be helpful.  You may use a stool softener, such as Colace (over the counter) 100 mg twice a day.  Use MiraLax (over the counter) for constipation as needed.   Diet - low sodium heart healthy    Complete by:  As directed   Discharge instructions    Complete by:  As directed   Maintain surgical dressing until follow up in the clinic. If the edges start to pull up, may reinforce with tape. If the dressing is no longer working, may remove and cover with gauze and tape, but must keep the area dry and clean.  Follow up in 2 weeks at [facility]. Call with any questions or concerns.   Increase activity slowly as tolerated    Complete by:  As directed   Weight bearing as tolerated with assist device (walker, cane, etc) as directed, use it as long as suggested by your surgeon or therapist, typically at least 4-6 weeks.   TED hose    Complete by:  As directed   Use stockings (TED hose) for 2 weeks on both leg(s).  You may remove them at night for sleeping.         Medication List    STOP taking these medications   oxyCODONE-acetaminophen 5-325 MG tablet Commonly known as:  PERCOCET/ROXICET     TAKE these medications   acetaminophen 325 MG tablet Commonly known as:  TYLENOL Take 2 tablets (650 mg total) by mouth every 6 (six) hours as needed for mild pain (or Fever >/= 101).   aspirin EC 81 MG tablet Take 81 mg by mouth every morning.   atorvastatin 80 MG tablet Commonly known as:  LIPITOR Take 80 mg by mouth every evening.   citalopram 40 MG tablet Commonly known as:  CELEXA Take 40 mg  by mouth daily.   clopidogrel 75 MG tablet Commonly known as:  PLAVIX Take 75 mg by mouth every morning.   diazepam 10 MG tablet Commonly known as:  VALIUM Take 10 mg by mouth every 12 (twelve) hours as needed for anxiety.   docusate sodium 100 MG capsule Commonly known as:  COLACE Take 1 capsule (100 mg total) by mouth 2 (two) times daily.   enoxaparin 40 MG/0.4ML injection Commonly known as:  LOVENOX Inject 0.4 mLs (40 mg total) into the skin daily. Use along with Coumadin to obtain therapeutic INR. May stop Lovenox once INR >/= 1.8.   ferrous sulfate 325 (65 FE) MG tablet Take 1 tablet (325 mg total) by mouth 3 (three) times daily after meals.   gabapentin 800 MG tablet Commonly known as:  NEURONTIN Take 800 mg by mouth 2 (two) times daily. As needed for pain   methocarbamol 500 MG tablet Commonly known as:  ROBAXIN Take 1 tablet (500 mg total) by mouth every 6 (six) hours as needed for muscle spasms.   omeprazole 40 MG capsule Commonly known as:  PRILOSEC Take 40 mg by mouth 2 (two) times daily.   Oxycodone HCl 10 MG Tabs Take 1-2 tablets (10-20 mg total) by mouth every 4 (four) hours.   polyethylene glycol packet Commonly known as:  MIRALAX / GLYCOLAX Take 17 g by mouth 2 (two) times daily.   warfarin 5 MG tablet Commonly known as:  COUMADIN Take 1 tablet (5 mg total) by mouth one time only at 6 PM. Use along with Lovenox to  obtain therapeutic INR. Daily INR to monitor levels and dosing adjusted by pharmacy to obtain and maintain therapeutic INR of 2-3. Last dose in the hospital was 5 mg. What changed:  how much to take  when to take this  additional instructions        Signed: [name]. Jernard Reiber   PA-C  07/23/2016, 12:00 PM

## 2016-07-24 DIAGNOSIS — Z5181 Encounter for therapeutic drug level monitoring: Secondary | ICD-10-CM | POA: Diagnosis not present

## 2016-07-24 DIAGNOSIS — Z79891 Long term (current) use of opiate analgesic: Secondary | ICD-10-CM | POA: Diagnosis not present

## 2016-07-24 DIAGNOSIS — Z86711 Personal history of pulmonary embolism: Secondary | ICD-10-CM | POA: Diagnosis not present

## 2016-07-24 DIAGNOSIS — Z7901 Long term (current) use of anticoagulants: Secondary | ICD-10-CM | POA: Diagnosis not present

## 2016-07-24 DIAGNOSIS — Z96652 Presence of left artificial knee joint: Secondary | ICD-10-CM | POA: Diagnosis not present

## 2016-07-24 DIAGNOSIS — I2510 Atherosclerotic heart disease of native coronary artery without angina pectoris: Secondary | ICD-10-CM | POA: Diagnosis not present

## 2016-07-24 DIAGNOSIS — Z7982 Long term (current) use of aspirin: Secondary | ICD-10-CM | POA: Diagnosis not present

## 2016-07-24 DIAGNOSIS — G2581 Restless legs syndrome: Secondary | ICD-10-CM | POA: Diagnosis not present

## 2016-07-24 DIAGNOSIS — Z471 Aftercare following joint replacement surgery: Secondary | ICD-10-CM | POA: Diagnosis not present

## 2016-07-24 DIAGNOSIS — Z4801 Encounter for change or removal of surgical wound dressing: Secondary | ICD-10-CM | POA: Diagnosis not present

## 2016-07-25 DIAGNOSIS — Z7982 Long term (current) use of aspirin: Secondary | ICD-10-CM | POA: Diagnosis not present

## 2016-07-25 DIAGNOSIS — Z5181 Encounter for therapeutic drug level monitoring: Secondary | ICD-10-CM | POA: Diagnosis not present

## 2016-07-25 DIAGNOSIS — G2581 Restless legs syndrome: Secondary | ICD-10-CM | POA: Diagnosis not present

## 2016-07-25 DIAGNOSIS — I2510 Atherosclerotic heart disease of native coronary artery without angina pectoris: Secondary | ICD-10-CM | POA: Diagnosis not present

## 2016-07-25 DIAGNOSIS — Z86711 Personal history of pulmonary embolism: Secondary | ICD-10-CM | POA: Diagnosis not present

## 2016-07-25 DIAGNOSIS — Z471 Aftercare following joint replacement surgery: Secondary | ICD-10-CM | POA: Diagnosis not present

## 2016-07-25 DIAGNOSIS — Z4801 Encounter for change or removal of surgical wound dressing: Secondary | ICD-10-CM | POA: Diagnosis not present

## 2016-07-25 DIAGNOSIS — Z96652 Presence of left artificial knee joint: Secondary | ICD-10-CM | POA: Diagnosis not present

## 2016-07-25 DIAGNOSIS — Z79891 Long term (current) use of opiate analgesic: Secondary | ICD-10-CM | POA: Diagnosis not present

## 2016-07-25 DIAGNOSIS — Z7901 Long term (current) use of anticoagulants: Secondary | ICD-10-CM | POA: Diagnosis not present

## 2016-07-27 DIAGNOSIS — Z79891 Long term (current) use of opiate analgesic: Secondary | ICD-10-CM | POA: Diagnosis not present

## 2016-07-27 DIAGNOSIS — I2510 Atherosclerotic heart disease of native coronary artery without angina pectoris: Secondary | ICD-10-CM | POA: Diagnosis not present

## 2016-07-27 DIAGNOSIS — Z471 Aftercare following joint replacement surgery: Secondary | ICD-10-CM | POA: Diagnosis not present

## 2016-07-27 DIAGNOSIS — Z5181 Encounter for therapeutic drug level monitoring: Secondary | ICD-10-CM | POA: Diagnosis not present

## 2016-07-27 DIAGNOSIS — Z4801 Encounter for change or removal of surgical wound dressing: Secondary | ICD-10-CM | POA: Diagnosis not present

## 2016-07-27 DIAGNOSIS — Z7901 Long term (current) use of anticoagulants: Secondary | ICD-10-CM | POA: Diagnosis not present

## 2016-07-27 DIAGNOSIS — Z7982 Long term (current) use of aspirin: Secondary | ICD-10-CM | POA: Diagnosis not present

## 2016-07-27 DIAGNOSIS — Z96652 Presence of left artificial knee joint: Secondary | ICD-10-CM | POA: Diagnosis not present

## 2016-07-27 DIAGNOSIS — G2581 Restless legs syndrome: Secondary | ICD-10-CM | POA: Diagnosis not present

## 2016-07-27 DIAGNOSIS — Z86711 Personal history of pulmonary embolism: Secondary | ICD-10-CM | POA: Diagnosis not present

## 2016-07-30 DIAGNOSIS — Z5181 Encounter for therapeutic drug level monitoring: Secondary | ICD-10-CM | POA: Diagnosis not present

## 2016-07-30 DIAGNOSIS — Z4801 Encounter for change or removal of surgical wound dressing: Secondary | ICD-10-CM | POA: Diagnosis not present

## 2016-07-30 DIAGNOSIS — G2581 Restless legs syndrome: Secondary | ICD-10-CM | POA: Diagnosis not present

## 2016-07-30 DIAGNOSIS — Z79891 Long term (current) use of opiate analgesic: Secondary | ICD-10-CM | POA: Diagnosis not present

## 2016-07-30 DIAGNOSIS — Z96652 Presence of left artificial knee joint: Secondary | ICD-10-CM | POA: Diagnosis not present

## 2016-07-30 DIAGNOSIS — Z7982 Long term (current) use of aspirin: Secondary | ICD-10-CM | POA: Diagnosis not present

## 2016-07-30 DIAGNOSIS — Z86711 Personal history of pulmonary embolism: Secondary | ICD-10-CM | POA: Diagnosis not present

## 2016-07-30 DIAGNOSIS — I2510 Atherosclerotic heart disease of native coronary artery without angina pectoris: Secondary | ICD-10-CM | POA: Diagnosis not present

## 2016-07-30 DIAGNOSIS — Z471 Aftercare following joint replacement surgery: Secondary | ICD-10-CM | POA: Diagnosis not present

## 2016-07-30 DIAGNOSIS — Z7901 Long term (current) use of anticoagulants: Secondary | ICD-10-CM | POA: Diagnosis not present

## 2016-08-02 DIAGNOSIS — G2581 Restless legs syndrome: Secondary | ICD-10-CM | POA: Diagnosis not present

## 2016-08-02 DIAGNOSIS — Z4801 Encounter for change or removal of surgical wound dressing: Secondary | ICD-10-CM | POA: Diagnosis not present

## 2016-08-02 DIAGNOSIS — Z7901 Long term (current) use of anticoagulants: Secondary | ICD-10-CM | POA: Diagnosis not present

## 2016-08-02 DIAGNOSIS — I2510 Atherosclerotic heart disease of native coronary artery without angina pectoris: Secondary | ICD-10-CM | POA: Diagnosis not present

## 2016-08-02 DIAGNOSIS — Z96652 Presence of left artificial knee joint: Secondary | ICD-10-CM | POA: Diagnosis not present

## 2016-08-02 DIAGNOSIS — Z7982 Long term (current) use of aspirin: Secondary | ICD-10-CM | POA: Diagnosis not present

## 2016-08-02 DIAGNOSIS — Z86711 Personal history of pulmonary embolism: Secondary | ICD-10-CM | POA: Diagnosis not present

## 2016-08-02 DIAGNOSIS — Z5181 Encounter for therapeutic drug level monitoring: Secondary | ICD-10-CM | POA: Diagnosis not present

## 2016-08-02 DIAGNOSIS — Z79891 Long term (current) use of opiate analgesic: Secondary | ICD-10-CM | POA: Diagnosis not present

## 2016-08-02 DIAGNOSIS — Z471 Aftercare following joint replacement surgery: Secondary | ICD-10-CM | POA: Diagnosis not present

## 2016-08-06 DIAGNOSIS — Z86711 Personal history of pulmonary embolism: Secondary | ICD-10-CM | POA: Diagnosis not present

## 2016-08-06 DIAGNOSIS — Z96652 Presence of left artificial knee joint: Secondary | ICD-10-CM | POA: Diagnosis not present

## 2016-08-06 DIAGNOSIS — G2581 Restless legs syndrome: Secondary | ICD-10-CM | POA: Diagnosis not present

## 2016-08-06 DIAGNOSIS — Z4801 Encounter for change or removal of surgical wound dressing: Secondary | ICD-10-CM | POA: Diagnosis not present

## 2016-08-06 DIAGNOSIS — Z7901 Long term (current) use of anticoagulants: Secondary | ICD-10-CM | POA: Diagnosis not present

## 2016-08-06 DIAGNOSIS — Z471 Aftercare following joint replacement surgery: Secondary | ICD-10-CM | POA: Diagnosis not present

## 2016-08-06 DIAGNOSIS — Z79891 Long term (current) use of opiate analgesic: Secondary | ICD-10-CM | POA: Diagnosis not present

## 2016-08-06 DIAGNOSIS — Z7982 Long term (current) use of aspirin: Secondary | ICD-10-CM | POA: Diagnosis not present

## 2016-08-06 DIAGNOSIS — Z5181 Encounter for therapeutic drug level monitoring: Secondary | ICD-10-CM | POA: Diagnosis not present

## 2016-08-06 DIAGNOSIS — I2510 Atherosclerotic heart disease of native coronary artery without angina pectoris: Secondary | ICD-10-CM | POA: Diagnosis not present

## 2016-08-08 DIAGNOSIS — M159 Polyosteoarthritis, unspecified: Secondary | ICD-10-CM | POA: Diagnosis not present

## 2016-08-08 DIAGNOSIS — M25562 Pain in left knee: Secondary | ICD-10-CM | POA: Diagnosis not present

## 2016-08-08 DIAGNOSIS — I82409 Acute embolism and thrombosis of unspecified deep veins of unspecified lower extremity: Secondary | ICD-10-CM | POA: Diagnosis not present

## 2016-08-08 DIAGNOSIS — R791 Abnormal coagulation profile: Secondary | ICD-10-CM | POA: Diagnosis not present

## 2016-08-15 DIAGNOSIS — R791 Abnormal coagulation profile: Secondary | ICD-10-CM | POA: Diagnosis not present

## 2016-08-23 DIAGNOSIS — R791 Abnormal coagulation profile: Secondary | ICD-10-CM | POA: Diagnosis not present

## 2016-08-24 DIAGNOSIS — R262 Difficulty in walking, not elsewhere classified: Secondary | ICD-10-CM | POA: Diagnosis not present

## 2016-08-24 DIAGNOSIS — M25662 Stiffness of left knee, not elsewhere classified: Secondary | ICD-10-CM | POA: Diagnosis not present

## 2016-08-24 DIAGNOSIS — M6281 Muscle weakness (generalized): Secondary | ICD-10-CM | POA: Diagnosis not present

## 2016-08-24 DIAGNOSIS — M25562 Pain in left knee: Secondary | ICD-10-CM | POA: Diagnosis not present

## 2016-08-28 DIAGNOSIS — R262 Difficulty in walking, not elsewhere classified: Secondary | ICD-10-CM | POA: Diagnosis not present

## 2016-08-28 DIAGNOSIS — M6281 Muscle weakness (generalized): Secondary | ICD-10-CM | POA: Diagnosis not present

## 2016-08-28 DIAGNOSIS — M25662 Stiffness of left knee, not elsewhere classified: Secondary | ICD-10-CM | POA: Diagnosis not present

## 2016-08-28 DIAGNOSIS — M25562 Pain in left knee: Secondary | ICD-10-CM | POA: Diagnosis not present

## 2016-08-29 DIAGNOSIS — R262 Difficulty in walking, not elsewhere classified: Secondary | ICD-10-CM | POA: Diagnosis not present

## 2016-08-29 DIAGNOSIS — M6281 Muscle weakness (generalized): Secondary | ICD-10-CM | POA: Diagnosis not present

## 2016-08-29 DIAGNOSIS — M25562 Pain in left knee: Secondary | ICD-10-CM | POA: Diagnosis not present

## 2016-08-29 DIAGNOSIS — M25662 Stiffness of left knee, not elsewhere classified: Secondary | ICD-10-CM | POA: Diagnosis not present

## 2016-08-30 DIAGNOSIS — Z471 Aftercare following joint replacement surgery: Secondary | ICD-10-CM | POA: Diagnosis not present

## 2016-09-14 DIAGNOSIS — M159 Polyosteoarthritis, unspecified: Secondary | ICD-10-CM | POA: Diagnosis not present

## 2016-09-14 DIAGNOSIS — M545 Low back pain: Secondary | ICD-10-CM | POA: Diagnosis not present

## 2016-09-14 DIAGNOSIS — Z7901 Long term (current) use of anticoagulants: Secondary | ICD-10-CM | POA: Diagnosis not present

## 2016-09-14 DIAGNOSIS — E785 Hyperlipidemia, unspecified: Secondary | ICD-10-CM | POA: Diagnosis not present

## 2016-09-14 DIAGNOSIS — Z23 Encounter for immunization: Secondary | ICD-10-CM | POA: Diagnosis not present

## 2016-09-14 DIAGNOSIS — I10 Essential (primary) hypertension: Secondary | ICD-10-CM | POA: Diagnosis not present

## 2016-09-14 DIAGNOSIS — I82409 Acute embolism and thrombosis of unspecified deep veins of unspecified lower extremity: Secondary | ICD-10-CM | POA: Diagnosis not present

## 2016-09-24 DIAGNOSIS — Z7901 Long term (current) use of anticoagulants: Secondary | ICD-10-CM | POA: Diagnosis not present

## 2016-10-11 DIAGNOSIS — M159 Polyosteoarthritis, unspecified: Secondary | ICD-10-CM | POA: Diagnosis not present

## 2016-10-11 DIAGNOSIS — Z7901 Long term (current) use of anticoagulants: Secondary | ICD-10-CM | POA: Diagnosis not present

## 2016-10-11 DIAGNOSIS — I10 Essential (primary) hypertension: Secondary | ICD-10-CM | POA: Diagnosis not present

## 2016-10-11 DIAGNOSIS — I82409 Acute embolism and thrombosis of unspecified deep veins of unspecified lower extremity: Secondary | ICD-10-CM | POA: Diagnosis not present

## 2016-10-11 DIAGNOSIS — M545 Low back pain: Secondary | ICD-10-CM | POA: Diagnosis not present

## 2016-10-11 DIAGNOSIS — Z79899 Other long term (current) drug therapy: Secondary | ICD-10-CM | POA: Diagnosis not present

## 2016-10-18 DIAGNOSIS — Z7901 Long term (current) use of anticoagulants: Secondary | ICD-10-CM | POA: Diagnosis not present

## 2016-10-30 DIAGNOSIS — R52 Pain, unspecified: Secondary | ICD-10-CM | POA: Diagnosis not present

## 2016-10-30 DIAGNOSIS — I82409 Acute embolism and thrombosis of unspecified deep veins of unspecified lower extremity: Secondary | ICD-10-CM | POA: Diagnosis not present

## 2016-10-30 DIAGNOSIS — J209 Acute bronchitis, unspecified: Secondary | ICD-10-CM | POA: Diagnosis not present

## 2016-10-30 DIAGNOSIS — I10 Essential (primary) hypertension: Secondary | ICD-10-CM | POA: Diagnosis not present

## 2016-10-30 DIAGNOSIS — M545 Low back pain: Secondary | ICD-10-CM | POA: Diagnosis not present

## 2016-11-05 DIAGNOSIS — M25551 Pain in right hip: Secondary | ICD-10-CM | POA: Diagnosis not present

## 2016-11-05 DIAGNOSIS — I82409 Acute embolism and thrombosis of unspecified deep veins of unspecified lower extremity: Secondary | ICD-10-CM | POA: Diagnosis not present

## 2016-11-05 DIAGNOSIS — M545 Low back pain: Secondary | ICD-10-CM | POA: Diagnosis not present

## 2016-11-05 DIAGNOSIS — I10 Essential (primary) hypertension: Secondary | ICD-10-CM | POA: Diagnosis not present

## 2016-11-08 DIAGNOSIS — I82409 Acute embolism and thrombosis of unspecified deep veins of unspecified lower extremity: Secondary | ICD-10-CM | POA: Diagnosis not present

## 2016-11-08 DIAGNOSIS — I10 Essential (primary) hypertension: Secondary | ICD-10-CM | POA: Diagnosis not present

## 2016-11-08 DIAGNOSIS — Z7901 Long term (current) use of anticoagulants: Secondary | ICD-10-CM | POA: Diagnosis not present

## 2016-11-08 DIAGNOSIS — I2510 Atherosclerotic heart disease of native coronary artery without angina pectoris: Secondary | ICD-10-CM | POA: Diagnosis not present

## 2016-11-08 DIAGNOSIS — M25562 Pain in left knee: Secondary | ICD-10-CM | POA: Diagnosis not present

## 2016-12-03 ENCOUNTER — Emergency Department (HOSPITAL_COMMUNITY): Payer: Medicare Other

## 2016-12-03 ENCOUNTER — Emergency Department (HOSPITAL_COMMUNITY)
Admission: EM | Admit: 2016-12-03 | Discharge: 2016-12-03 | Disposition: A | Discharge status: Home / Self Care | Payer: Medicare Other | Attending: Emergency Medicine | Admitting: Emergency Medicine

## 2016-12-03 DIAGNOSIS — R0789 Other chest pain: Secondary | ICD-10-CM | POA: Diagnosis not present

## 2016-12-03 DIAGNOSIS — Z5321 Procedure and treatment not carried out due to patient leaving prior to being seen by health care provider: Secondary | ICD-10-CM | POA: Diagnosis not present

## 2016-12-03 LAB — CBC
HCT: 33.8 % — ABNORMAL LOW (ref 36.0–46.0)
HEMOGLOBIN: 9.7 g/dL — AB (ref 12.0–15.0)
MCH: 22.4 pg — ABNORMAL LOW (ref 26.0–34.0)
MCHC: 28.7 g/dL — AB (ref 30.0–36.0)
MCV: 77.9 fL — ABNORMAL LOW (ref 78.0–100.0)
Platelets: 377 10*3/uL (ref 150–400)
RBC: 4.34 MIL/uL (ref 3.87–5.11)
RDW: 17.5 % — ABNORMAL HIGH (ref 11.5–15.5)
WBC: 6.3 10*3/uL (ref 4.0–10.5)

## 2016-12-03 LAB — BASIC METABOLIC PANEL
ANION GAP: 8 (ref 5–15)
BUN: 10 mg/dL (ref 6–20)
CALCIUM: 9.2 mg/dL (ref 8.9–10.3)
CO2: 24 mmol/L (ref 22–32)
Chloride: 105 mmol/L (ref 101–111)
Creatinine, Ser: 0.66 mg/dL (ref 0.44–1.00)
GLUCOSE: 101 mg/dL — AB (ref 65–99)
Potassium: 4 mmol/L (ref 3.5–5.1)
SODIUM: 137 mmol/L (ref 135–145)

## 2016-12-03 LAB — I-STAT TROPONIN, ED: TROPONIN I, POC: 0 ng/mL (ref 0.00–0.08)

## 2016-12-03 NOTE — ED Notes (Signed)
Pt's husband came out to the desk asking for "that female nurse tech."  Chrissie NoaWilliam NT unavailable, so Deandra NT went to room to assist.  Pt was found be throwing things in room and screaming.  Pt walked out of room and began screaming at staff in front of Res nurses' station.  Sts "this is ridiculous.  No one has been in there for hours" and began making accusation about staff.  This writer attempted to reassure Pt that we had drawn blood, EKG performed, and chest xray completed.  Pt proceeded to mock this Clinical research associatewriter and demand directions out of the department.  This Clinical research associatewriter showed Pt and her husband out.

## 2016-12-03 NOTE — ED Provider Notes (Signed)
7:15 PM I went in to go evaluate the patient and she had apparently just gotten up and left. I was unable to see her just prior due to a procedural sedation hours apart off. I did not evaluate this patient.   Pricilla LovelessScott Beyonka Pitney, MD 12/03/16 [phone]

## 2016-12-03 NOTE — ED Notes (Signed)
PT visitor came to desk request for her female tech to step in rm when ask if anything I could help with visitor state she would just like to see him. Enter rm asked PT if I could help, PT became upset stated she will report everyone here since she have been in ED since 1pm and have yet been seen. PT took off BP and threw over bed rail walked out rm, when charge info her she will go get MD PT request to show her the way out

## 2016-12-03 NOTE — ED Triage Notes (Addendum)
Patient reports constant left sided chest tightness x3 days. Denies radiation, N/V, and dizziness. Patient also reports bilateral arm muscle aching and cramping. Hx PE

## 2016-12-03 NOTE — ED Notes (Signed)
Pt described irritation with the fact it has been almost 2 hours since she has been in the room with no doctor.  Explained to patient possible delays and apologized.

## 2016-12-07 DIAGNOSIS — G4700 Insomnia, unspecified: Secondary | ICD-10-CM | POA: Diagnosis not present

## 2016-12-07 DIAGNOSIS — Z7901 Long term (current) use of anticoagulants: Secondary | ICD-10-CM | POA: Diagnosis not present

## 2016-12-07 DIAGNOSIS — I82409 Acute embolism and thrombosis of unspecified deep veins of unspecified lower extremity: Secondary | ICD-10-CM | POA: Diagnosis not present

## 2016-12-07 DIAGNOSIS — I2510 Atherosclerotic heart disease of native coronary artery without angina pectoris: Secondary | ICD-10-CM | POA: Diagnosis not present

## 2016-12-07 DIAGNOSIS — I10 Essential (primary) hypertension: Secondary | ICD-10-CM | POA: Diagnosis not present

## 2016-12-14 DIAGNOSIS — R791 Abnormal coagulation profile: Secondary | ICD-10-CM | POA: Diagnosis not present

## 2016-12-14 DIAGNOSIS — I2510 Atherosclerotic heart disease of native coronary artery without angina pectoris: Secondary | ICD-10-CM | POA: Diagnosis not present

## 2016-12-14 DIAGNOSIS — I10 Essential (primary) hypertension: Secondary | ICD-10-CM | POA: Diagnosis not present

## 2016-12-14 DIAGNOSIS — E785 Hyperlipidemia, unspecified: Secondary | ICD-10-CM | POA: Diagnosis not present

## 2016-12-14 DIAGNOSIS — I82409 Acute embolism and thrombosis of unspecified deep veins of unspecified lower extremity: Secondary | ICD-10-CM | POA: Diagnosis not present

## 2016-12-21 DIAGNOSIS — R791 Abnormal coagulation profile: Secondary | ICD-10-CM | POA: Diagnosis not present

## 2017-01-11 DIAGNOSIS — G4700 Insomnia, unspecified: Secondary | ICD-10-CM | POA: Diagnosis not present

## 2017-01-11 DIAGNOSIS — Z7901 Long term (current) use of anticoagulants: Secondary | ICD-10-CM | POA: Diagnosis not present

## 2017-01-11 DIAGNOSIS — K219 Gastro-esophageal reflux disease without esophagitis: Secondary | ICD-10-CM | POA: Diagnosis not present

## 2017-01-11 DIAGNOSIS — I82409 Acute embolism and thrombosis of unspecified deep veins of unspecified lower extremity: Secondary | ICD-10-CM | POA: Diagnosis not present

## 2017-01-11 DIAGNOSIS — M159 Polyosteoarthritis, unspecified: Secondary | ICD-10-CM | POA: Diagnosis not present

## 2017-01-12 IMAGING — CR DG CHEST 2V
2 series · 2 of 2 positions shown · non-contrast
Comparison: No recent prior.

CLINICAL DATA: Chest tightness.

EXAM:
CHEST  2 VIEW

[w chest pa]
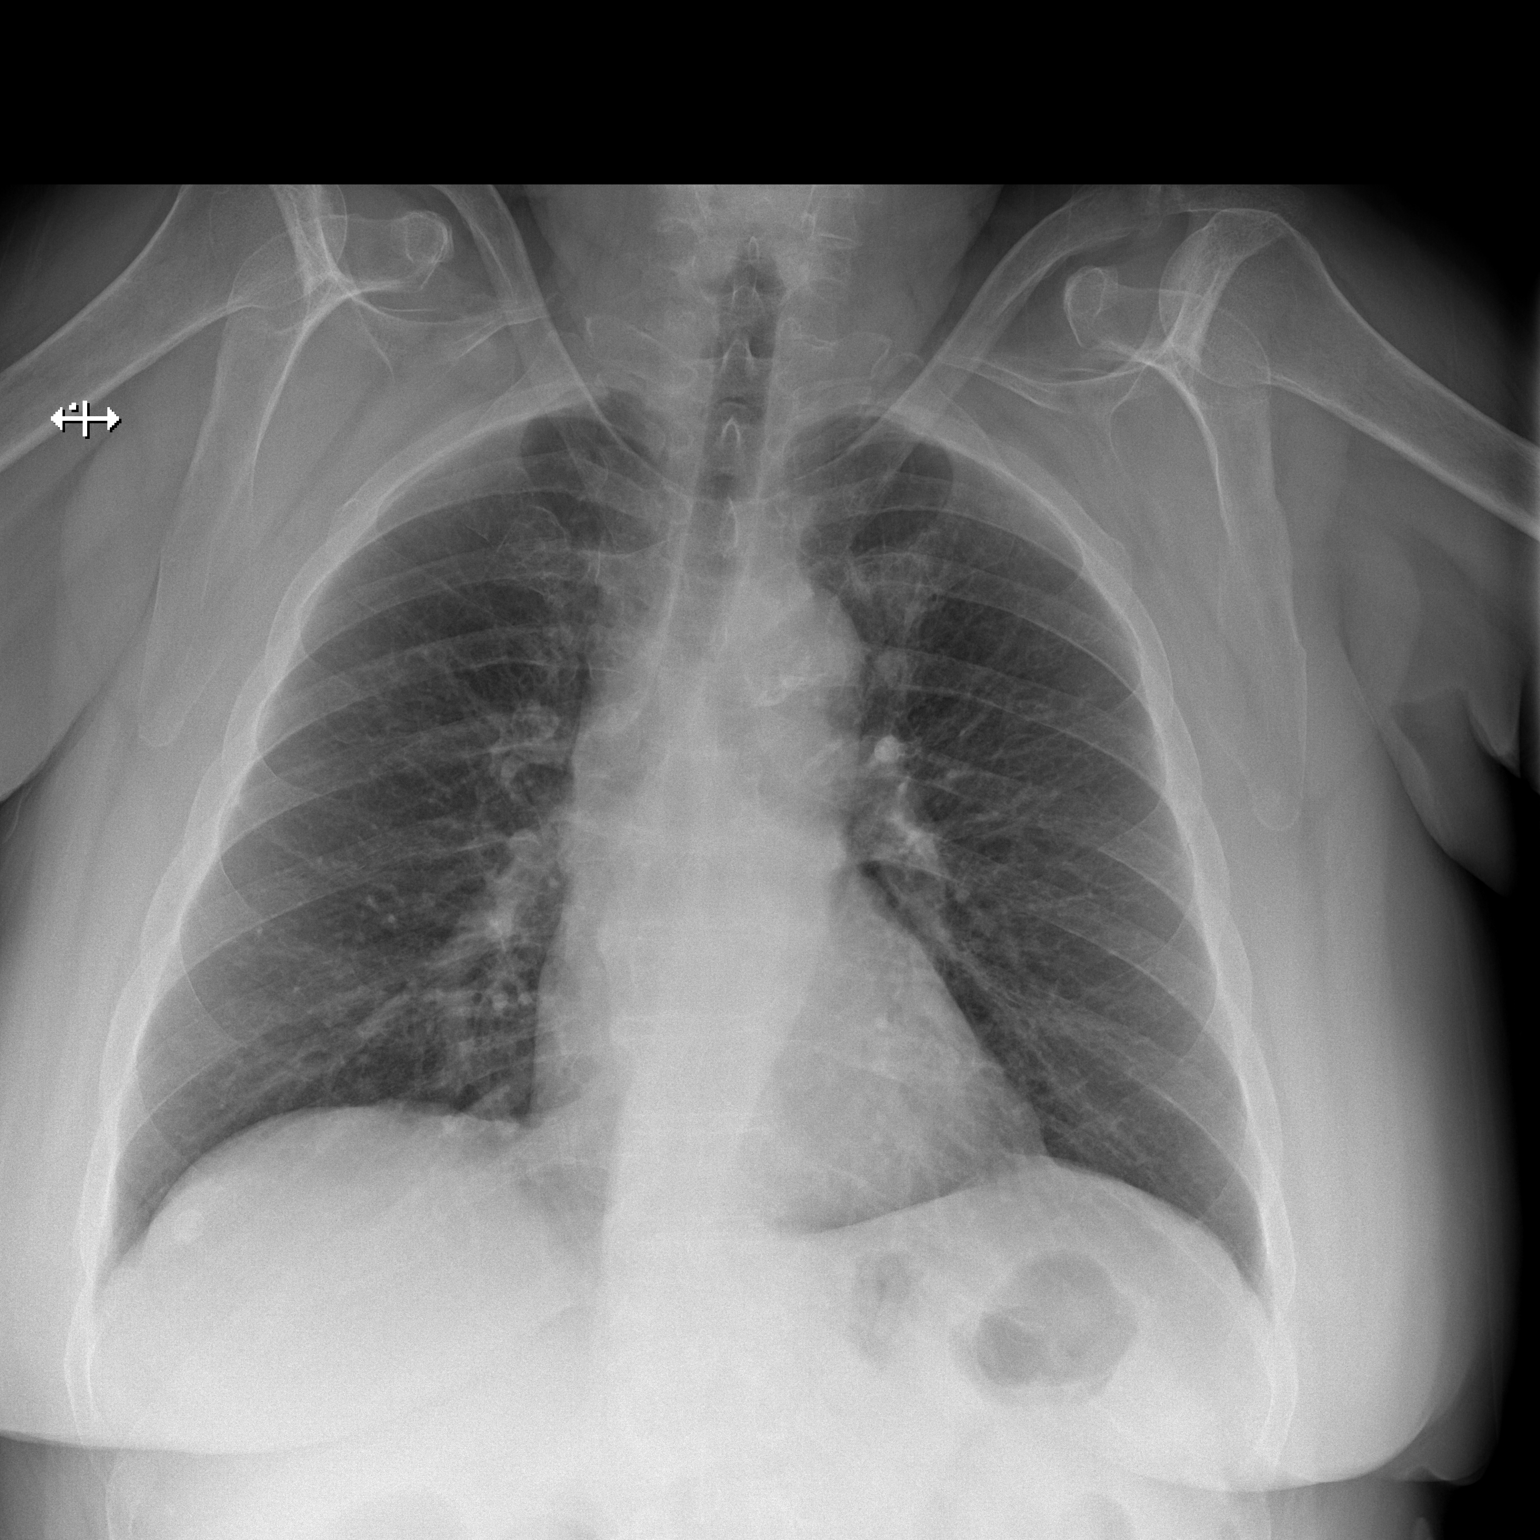

[w chest lat]
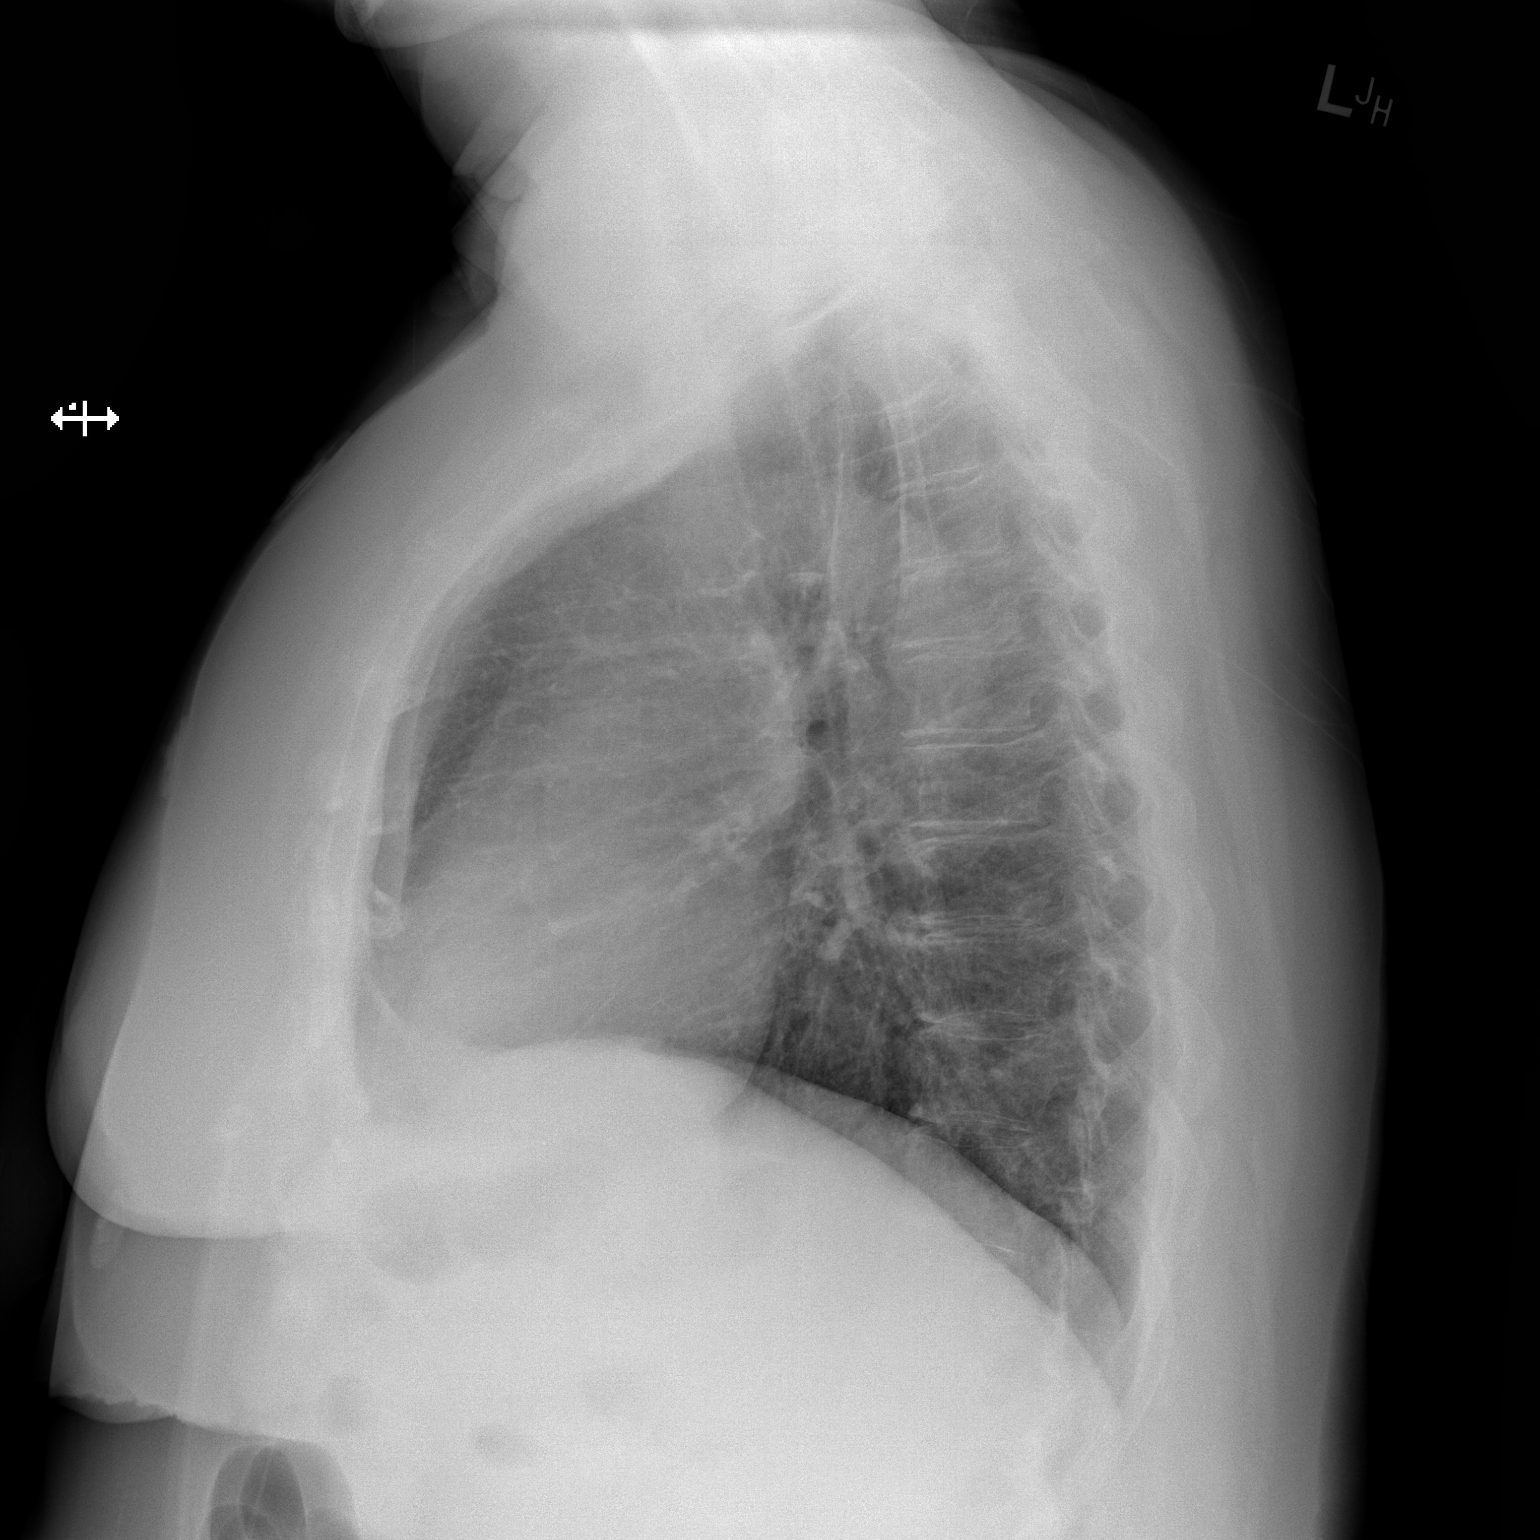

[2 of 2 positions shown; findings below may reference images not displayed]

FINDINGS: Minimal linear fullness of the anterior mediastinum most likely
pleural thickening. Mild biapical pleural thickening noted. No focal
mediastinal mass lesion. Heart size normal. No focal infiltrate. No
pleural effusion or pneumothorax. No acute bony abnormality .
IMPRESSION: No acute cardiopulmonary disease. Pleural thickening most likely
secondary to scarring.

## 2017-02-07 DIAGNOSIS — M159 Polyosteoarthritis, unspecified: Secondary | ICD-10-CM | POA: Diagnosis not present

## 2017-02-07 DIAGNOSIS — G4700 Insomnia, unspecified: Secondary | ICD-10-CM | POA: Diagnosis not present

## 2017-02-07 DIAGNOSIS — I82409 Acute embolism and thrombosis of unspecified deep veins of unspecified lower extremity: Secondary | ICD-10-CM | POA: Diagnosis not present

## 2017-02-07 DIAGNOSIS — Z7901 Long term (current) use of anticoagulants: Secondary | ICD-10-CM | POA: Diagnosis not present

## 2017-02-07 DIAGNOSIS — K219 Gastro-esophageal reflux disease without esophagitis: Secondary | ICD-10-CM | POA: Diagnosis not present

## 2017-02-22 DIAGNOSIS — K219 Gastro-esophageal reflux disease without esophagitis: Secondary | ICD-10-CM | POA: Diagnosis not present

## 2017-02-22 DIAGNOSIS — M159 Polyosteoarthritis, unspecified: Secondary | ICD-10-CM | POA: Diagnosis not present

## 2017-02-22 DIAGNOSIS — G4700 Insomnia, unspecified: Secondary | ICD-10-CM | POA: Diagnosis not present

## 2017-02-22 DIAGNOSIS — I82409 Acute embolism and thrombosis of unspecified deep veins of unspecified lower extremity: Secondary | ICD-10-CM | POA: Diagnosis not present

## 2017-03-07 DIAGNOSIS — M159 Polyosteoarthritis, unspecified: Secondary | ICD-10-CM | POA: Diagnosis not present

## 2017-03-07 DIAGNOSIS — K219 Gastro-esophageal reflux disease without esophagitis: Secondary | ICD-10-CM | POA: Diagnosis not present

## 2017-03-07 DIAGNOSIS — G4700 Insomnia, unspecified: Secondary | ICD-10-CM | POA: Diagnosis not present

## 2017-03-07 DIAGNOSIS — M25562 Pain in left knee: Secondary | ICD-10-CM | POA: Diagnosis not present

## 2017-03-07 DIAGNOSIS — I82409 Acute embolism and thrombosis of unspecified deep veins of unspecified lower extremity: Secondary | ICD-10-CM | POA: Diagnosis not present

## 2017-03-14 DIAGNOSIS — K219 Gastro-esophageal reflux disease without esophagitis: Secondary | ICD-10-CM | POA: Diagnosis not present

## 2017-03-14 DIAGNOSIS — G4700 Insomnia, unspecified: Secondary | ICD-10-CM | POA: Diagnosis not present

## 2017-03-14 DIAGNOSIS — K5900 Constipation, unspecified: Secondary | ICD-10-CM | POA: Diagnosis not present

## 2017-03-14 DIAGNOSIS — I10 Essential (primary) hypertension: Secondary | ICD-10-CM | POA: Diagnosis not present

## 2017-03-14 DIAGNOSIS — M159 Polyosteoarthritis, unspecified: Secondary | ICD-10-CM | POA: Diagnosis not present

## 2017-03-14 DIAGNOSIS — I82409 Acute embolism and thrombosis of unspecified deep veins of unspecified lower extremity: Secondary | ICD-10-CM | POA: Diagnosis not present

## 2017-03-14 DIAGNOSIS — E785 Hyperlipidemia, unspecified: Secondary | ICD-10-CM | POA: Diagnosis not present

## 2017-03-20 DIAGNOSIS — M159 Polyosteoarthritis, unspecified: Secondary | ICD-10-CM | POA: Diagnosis not present

## 2017-03-20 DIAGNOSIS — G4700 Insomnia, unspecified: Secondary | ICD-10-CM | POA: Diagnosis not present

## 2017-03-20 DIAGNOSIS — Z79891 Long term (current) use of opiate analgesic: Secondary | ICD-10-CM | POA: Diagnosis not present

## 2017-03-20 DIAGNOSIS — K219 Gastro-esophageal reflux disease without esophagitis: Secondary | ICD-10-CM | POA: Diagnosis not present

## 2017-03-20 DIAGNOSIS — I82409 Acute embolism and thrombosis of unspecified deep veins of unspecified lower extremity: Secondary | ICD-10-CM | POA: Diagnosis not present

## 2017-04-02 DIAGNOSIS — Z96652 Presence of left artificial knee joint: Secondary | ICD-10-CM | POA: Diagnosis not present

## 2017-04-02 DIAGNOSIS — Z471 Aftercare following joint replacement surgery: Secondary | ICD-10-CM | POA: Diagnosis not present

## 2017-04-09 DIAGNOSIS — G4700 Insomnia, unspecified: Secondary | ICD-10-CM | POA: Diagnosis not present

## 2017-04-09 DIAGNOSIS — I82409 Acute embolism and thrombosis of unspecified deep veins of unspecified lower extremity: Secondary | ICD-10-CM | POA: Diagnosis not present

## 2017-04-09 DIAGNOSIS — M545 Low back pain: Secondary | ICD-10-CM | POA: Diagnosis not present

## 2017-04-09 DIAGNOSIS — J209 Acute bronchitis, unspecified: Secondary | ICD-10-CM | POA: Diagnosis not present

## 2017-04-09 DIAGNOSIS — M159 Polyosteoarthritis, unspecified: Secondary | ICD-10-CM | POA: Diagnosis not present

## 2017-04-16 DIAGNOSIS — R791 Abnormal coagulation profile: Secondary | ICD-10-CM | POA: Diagnosis not present

## 2017-05-08 DIAGNOSIS — Z96652 Presence of left artificial knee joint: Secondary | ICD-10-CM | POA: Diagnosis not present

## 2017-05-08 DIAGNOSIS — M5416 Radiculopathy, lumbar region: Secondary | ICD-10-CM | POA: Diagnosis not present

## 2017-05-08 DIAGNOSIS — Z471 Aftercare following joint replacement surgery: Secondary | ICD-10-CM | POA: Diagnosis not present

## 2017-05-09 DIAGNOSIS — I82409 Acute embolism and thrombosis of unspecified deep veins of unspecified lower extremity: Secondary | ICD-10-CM | POA: Diagnosis not present

## 2017-05-09 DIAGNOSIS — I10 Essential (primary) hypertension: Secondary | ICD-10-CM | POA: Diagnosis not present

## 2017-05-09 DIAGNOSIS — R791 Abnormal coagulation profile: Secondary | ICD-10-CM | POA: Diagnosis not present

## 2017-05-09 DIAGNOSIS — G4700 Insomnia, unspecified: Secondary | ICD-10-CM | POA: Diagnosis not present

## 2017-05-21 DIAGNOSIS — M545 Low back pain: Secondary | ICD-10-CM | POA: Diagnosis not present

## 2017-05-21 DIAGNOSIS — S3993XA Unspecified injury of pelvis, initial encounter: Secondary | ICD-10-CM | POA: Diagnosis not present

## 2017-05-21 DIAGNOSIS — R531 Weakness: Secondary | ICD-10-CM | POA: Diagnosis not present

## 2017-05-21 DIAGNOSIS — M25562 Pain in left knee: Secondary | ICD-10-CM | POA: Diagnosis not present

## 2017-05-21 DIAGNOSIS — S8002XA Contusion of left knee, initial encounter: Secondary | ICD-10-CM | POA: Diagnosis not present

## 2017-05-21 DIAGNOSIS — S39012A Strain of muscle, fascia and tendon of lower back, initial encounter: Secondary | ICD-10-CM | POA: Diagnosis not present

## 2017-05-21 DIAGNOSIS — M549 Dorsalgia, unspecified: Secondary | ICD-10-CM | POA: Diagnosis not present

## 2017-05-31 DIAGNOSIS — Z7901 Long term (current) use of anticoagulants: Secondary | ICD-10-CM | POA: Diagnosis not present

## 2017-06-06 DIAGNOSIS — R791 Abnormal coagulation profile: Secondary | ICD-10-CM | POA: Diagnosis not present

## 2017-06-06 DIAGNOSIS — I10 Essential (primary) hypertension: Secondary | ICD-10-CM | POA: Diagnosis not present

## 2017-06-06 DIAGNOSIS — I82409 Acute embolism and thrombosis of unspecified deep veins of unspecified lower extremity: Secondary | ICD-10-CM | POA: Diagnosis not present

## 2017-06-06 DIAGNOSIS — M159 Polyosteoarthritis, unspecified: Secondary | ICD-10-CM | POA: Diagnosis not present

## 2017-06-14 DIAGNOSIS — R791 Abnormal coagulation profile: Secondary | ICD-10-CM | POA: Diagnosis not present

## 2017-06-20 DIAGNOSIS — I82409 Acute embolism and thrombosis of unspecified deep veins of unspecified lower extremity: Secondary | ICD-10-CM | POA: Diagnosis not present

## 2017-06-20 DIAGNOSIS — I10 Essential (primary) hypertension: Secondary | ICD-10-CM | POA: Diagnosis not present

## 2017-06-20 DIAGNOSIS — R791 Abnormal coagulation profile: Secondary | ICD-10-CM | POA: Diagnosis not present

## 2017-06-20 DIAGNOSIS — G4700 Insomnia, unspecified: Secondary | ICD-10-CM | POA: Diagnosis not present

## 2017-07-07 DIAGNOSIS — R079 Chest pain, unspecified: Secondary | ICD-10-CM | POA: Diagnosis not present

## 2017-07-07 DIAGNOSIS — D509 Iron deficiency anemia, unspecified: Secondary | ICD-10-CM | POA: Diagnosis not present

## 2017-07-07 DIAGNOSIS — R109 Unspecified abdominal pain: Secondary | ICD-10-CM | POA: Diagnosis not present

## 2017-07-07 DIAGNOSIS — R0602 Shortness of breath: Secondary | ICD-10-CM | POA: Diagnosis not present

## 2017-07-09 DIAGNOSIS — D649 Anemia, unspecified: Secondary | ICD-10-CM | POA: Diagnosis not present

## 2017-07-09 DIAGNOSIS — I10 Essential (primary) hypertension: Secondary | ICD-10-CM | POA: Diagnosis not present

## 2017-07-09 DIAGNOSIS — I82409 Acute embolism and thrombosis of unspecified deep veins of unspecified lower extremity: Secondary | ICD-10-CM | POA: Diagnosis not present

## 2017-07-24 DIAGNOSIS — I10 Essential (primary) hypertension: Secondary | ICD-10-CM | POA: Diagnosis not present

## 2017-07-24 DIAGNOSIS — R791 Abnormal coagulation profile: Secondary | ICD-10-CM | POA: Diagnosis not present

## 2017-07-24 DIAGNOSIS — I82409 Acute embolism and thrombosis of unspecified deep veins of unspecified lower extremity: Secondary | ICD-10-CM | POA: Diagnosis not present

## 2017-07-29 DIAGNOSIS — Z471 Aftercare following joint replacement surgery: Secondary | ICD-10-CM | POA: Diagnosis not present

## 2017-07-29 DIAGNOSIS — Z96652 Presence of left artificial knee joint: Secondary | ICD-10-CM | POA: Diagnosis not present

## 2017-07-29 DIAGNOSIS — M5416 Radiculopathy, lumbar region: Secondary | ICD-10-CM | POA: Diagnosis not present

## 2017-07-31 DIAGNOSIS — R791 Abnormal coagulation profile: Secondary | ICD-10-CM | POA: Diagnosis not present

## 2017-08-02 DIAGNOSIS — Z9889 Other specified postprocedural states: Secondary | ICD-10-CM | POA: Diagnosis not present

## 2017-08-02 DIAGNOSIS — M5441 Lumbago with sciatica, right side: Secondary | ICD-10-CM | POA: Diagnosis not present

## 2017-08-02 DIAGNOSIS — M545 Low back pain: Secondary | ICD-10-CM | POA: Diagnosis not present

## 2017-08-02 DIAGNOSIS — M5416 Radiculopathy, lumbar region: Secondary | ICD-10-CM | POA: Diagnosis not present

## 2017-08-08 DIAGNOSIS — M5441 Lumbago with sciatica, right side: Secondary | ICD-10-CM | POA: Diagnosis not present

## 2017-08-09 DIAGNOSIS — Z7901 Long term (current) use of anticoagulants: Secondary | ICD-10-CM | POA: Diagnosis not present

## 2017-08-09 DIAGNOSIS — G2581 Restless legs syndrome: Secondary | ICD-10-CM | POA: Diagnosis not present

## 2017-08-09 DIAGNOSIS — I2510 Atherosclerotic heart disease of native coronary artery without angina pectoris: Secondary | ICD-10-CM | POA: Diagnosis not present

## 2017-08-09 DIAGNOSIS — M159 Polyosteoarthritis, unspecified: Secondary | ICD-10-CM | POA: Diagnosis not present

## 2017-08-12 DIAGNOSIS — Z9889 Other specified postprocedural states: Secondary | ICD-10-CM | POA: Diagnosis not present

## 2017-08-12 DIAGNOSIS — Z981 Arthrodesis status: Secondary | ICD-10-CM | POA: Diagnosis not present

## 2017-08-12 DIAGNOSIS — M5416 Radiculopathy, lumbar region: Secondary | ICD-10-CM | POA: Diagnosis not present

## 2017-08-12 DIAGNOSIS — M5441 Lumbago with sciatica, right side: Secondary | ICD-10-CM | POA: Diagnosis not present

## 2017-08-21 DIAGNOSIS — M961 Postlaminectomy syndrome, not elsewhere classified: Secondary | ICD-10-CM | POA: Diagnosis not present

## 2017-08-22 DIAGNOSIS — I10 Essential (primary) hypertension: Secondary | ICD-10-CM | POA: Diagnosis not present

## 2017-08-22 DIAGNOSIS — M159 Polyosteoarthritis, unspecified: Secondary | ICD-10-CM | POA: Diagnosis not present

## 2017-08-22 DIAGNOSIS — Z7901 Long term (current) use of anticoagulants: Secondary | ICD-10-CM | POA: Diagnosis not present

## 2017-08-22 DIAGNOSIS — E785 Hyperlipidemia, unspecified: Secondary | ICD-10-CM | POA: Diagnosis not present

## 2017-08-22 DIAGNOSIS — I2510 Atherosclerotic heart disease of native coronary artery without angina pectoris: Secondary | ICD-10-CM | POA: Diagnosis not present

## 2017-08-29 DIAGNOSIS — Z471 Aftercare following joint replacement surgery: Secondary | ICD-10-CM | POA: Diagnosis not present

## 2017-08-29 DIAGNOSIS — Z96652 Presence of left artificial knee joint: Secondary | ICD-10-CM | POA: Diagnosis not present

## 2017-09-02 DIAGNOSIS — M5416 Radiculopathy, lumbar region: Secondary | ICD-10-CM | POA: Diagnosis not present

## 2017-09-02 DIAGNOSIS — M961 Postlaminectomy syndrome, not elsewhere classified: Secondary | ICD-10-CM | POA: Diagnosis not present

## 2017-09-09 DIAGNOSIS — Z1211 Encounter for screening for malignant neoplasm of colon: Secondary | ICD-10-CM | POA: Diagnosis not present

## 2017-09-09 DIAGNOSIS — I2510 Atherosclerotic heart disease of native coronary artery without angina pectoris: Secondary | ICD-10-CM | POA: Diagnosis not present

## 2017-09-09 DIAGNOSIS — R791 Abnormal coagulation profile: Secondary | ICD-10-CM | POA: Diagnosis not present

## 2017-09-09 DIAGNOSIS — Z1389 Encounter for screening for other disorder: Secondary | ICD-10-CM | POA: Diagnosis not present

## 2017-09-09 DIAGNOSIS — E785 Hyperlipidemia, unspecified: Secondary | ICD-10-CM | POA: Diagnosis not present

## 2017-09-09 DIAGNOSIS — I10 Essential (primary) hypertension: Secondary | ICD-10-CM | POA: Diagnosis not present

## 2017-09-23 DIAGNOSIS — M159 Polyosteoarthritis, unspecified: Secondary | ICD-10-CM | POA: Diagnosis not present

## 2017-09-23 DIAGNOSIS — I2510 Atherosclerotic heart disease of native coronary artery without angina pectoris: Secondary | ICD-10-CM | POA: Diagnosis not present

## 2017-09-23 DIAGNOSIS — Z23 Encounter for immunization: Secondary | ICD-10-CM | POA: Diagnosis not present

## 2017-09-23 DIAGNOSIS — I82409 Acute embolism and thrombosis of unspecified deep veins of unspecified lower extremity: Secondary | ICD-10-CM | POA: Diagnosis not present

## 2017-09-30 DIAGNOSIS — I10 Essential (primary) hypertension: Secondary | ICD-10-CM | POA: Diagnosis not present

## 2017-09-30 DIAGNOSIS — M159 Polyosteoarthritis, unspecified: Secondary | ICD-10-CM | POA: Diagnosis not present

## 2017-09-30 DIAGNOSIS — R791 Abnormal coagulation profile: Secondary | ICD-10-CM | POA: Diagnosis not present

## 2017-09-30 DIAGNOSIS — I2510 Atherosclerotic heart disease of native coronary artery without angina pectoris: Secondary | ICD-10-CM | POA: Diagnosis not present

## 2017-10-09 DIAGNOSIS — I82409 Acute embolism and thrombosis of unspecified deep veins of unspecified lower extremity: Secondary | ICD-10-CM | POA: Diagnosis not present

## 2017-10-09 DIAGNOSIS — E785 Hyperlipidemia, unspecified: Secondary | ICD-10-CM | POA: Diagnosis not present

## 2017-10-09 DIAGNOSIS — I2510 Atherosclerotic heart disease of native coronary artery without angina pectoris: Secondary | ICD-10-CM | POA: Diagnosis not present

## 2017-10-09 DIAGNOSIS — Z0000 Encounter for general adult medical examination without abnormal findings: Secondary | ICD-10-CM | POA: Diagnosis not present

## 2017-10-09 DIAGNOSIS — Z7901 Long term (current) use of anticoagulants: Secondary | ICD-10-CM | POA: Diagnosis not present

## 2017-10-11 DIAGNOSIS — S83421A Sprain of lateral collateral ligament of right knee, initial encounter: Secondary | ICD-10-CM | POA: Diagnosis not present

## 2017-10-11 DIAGNOSIS — M25561 Pain in right knee: Secondary | ICD-10-CM | POA: Diagnosis not present

## 2017-10-11 DIAGNOSIS — M25461 Effusion, right knee: Secondary | ICD-10-CM | POA: Diagnosis not present

## 2017-10-16 DIAGNOSIS — R791 Abnormal coagulation profile: Secondary | ICD-10-CM | POA: Diagnosis not present

## 2017-10-23 DIAGNOSIS — J208 Acute bronchitis due to other specified organisms: Secondary | ICD-10-CM | POA: Diagnosis not present

## 2017-10-23 DIAGNOSIS — D649 Anemia, unspecified: Secondary | ICD-10-CM | POA: Diagnosis not present

## 2017-10-23 DIAGNOSIS — R791 Abnormal coagulation profile: Secondary | ICD-10-CM | POA: Diagnosis not present

## 2017-10-23 DIAGNOSIS — M545 Low back pain: Secondary | ICD-10-CM | POA: Diagnosis not present

## 2017-10-23 DIAGNOSIS — E785 Hyperlipidemia, unspecified: Secondary | ICD-10-CM | POA: Diagnosis not present

## 2017-11-04 DIAGNOSIS — M545 Low back pain: Secondary | ICD-10-CM | POA: Diagnosis not present

## 2017-11-04 DIAGNOSIS — Z7901 Long term (current) use of anticoagulants: Secondary | ICD-10-CM | POA: Diagnosis not present

## 2017-11-04 DIAGNOSIS — E785 Hyperlipidemia, unspecified: Secondary | ICD-10-CM | POA: Diagnosis not present

## 2017-11-04 DIAGNOSIS — D649 Anemia, unspecified: Secondary | ICD-10-CM | POA: Diagnosis not present

## 2017-11-27 DIAGNOSIS — D649 Anemia, unspecified: Secondary | ICD-10-CM | POA: Diagnosis not present

## 2017-11-27 DIAGNOSIS — J208 Acute bronchitis due to other specified organisms: Secondary | ICD-10-CM | POA: Diagnosis not present

## 2017-11-27 DIAGNOSIS — M545 Low back pain: Secondary | ICD-10-CM | POA: Diagnosis not present

## 2017-11-27 DIAGNOSIS — E785 Hyperlipidemia, unspecified: Secondary | ICD-10-CM | POA: Diagnosis not present

## 2017-12-08 DIAGNOSIS — M79661 Pain in right lower leg: Secondary | ICD-10-CM | POA: Diagnosis not present

## 2017-12-08 DIAGNOSIS — Z79891 Long term (current) use of opiate analgesic: Secondary | ICD-10-CM | POA: Diagnosis not present

## 2017-12-08 DIAGNOSIS — Z79899 Other long term (current) drug therapy: Secondary | ICD-10-CM | POA: Diagnosis not present

## 2017-12-08 DIAGNOSIS — Z86711 Personal history of pulmonary embolism: Secondary | ICD-10-CM | POA: Diagnosis not present

## 2017-12-08 DIAGNOSIS — Z7901 Long term (current) use of anticoagulants: Secondary | ICD-10-CM | POA: Diagnosis not present

## 2017-12-08 DIAGNOSIS — G2581 Restless legs syndrome: Secondary | ICD-10-CM | POA: Diagnosis not present

## 2017-12-08 DIAGNOSIS — M79662 Pain in left lower leg: Secondary | ICD-10-CM | POA: Diagnosis not present

## 2017-12-08 DIAGNOSIS — G8929 Other chronic pain: Secondary | ICD-10-CM | POA: Diagnosis not present

## 2017-12-08 DIAGNOSIS — J449 Chronic obstructive pulmonary disease, unspecified: Secondary | ICD-10-CM | POA: Diagnosis not present

## 2017-12-11 DIAGNOSIS — D649 Anemia, unspecified: Secondary | ICD-10-CM | POA: Diagnosis not present

## 2017-12-11 DIAGNOSIS — E785 Hyperlipidemia, unspecified: Secondary | ICD-10-CM | POA: Diagnosis not present

## 2017-12-11 DIAGNOSIS — Z7901 Long term (current) use of anticoagulants: Secondary | ICD-10-CM | POA: Diagnosis not present

## 2017-12-11 DIAGNOSIS — M545 Low back pain: Secondary | ICD-10-CM | POA: Diagnosis not present

## 2018-01-10 DIAGNOSIS — I10 Essential (primary) hypertension: Secondary | ICD-10-CM | POA: Diagnosis not present

## 2018-01-10 DIAGNOSIS — Z7901 Long term (current) use of anticoagulants: Secondary | ICD-10-CM | POA: Diagnosis not present

## 2018-01-10 DIAGNOSIS — M545 Low back pain: Secondary | ICD-10-CM | POA: Diagnosis not present

## 2018-01-10 DIAGNOSIS — D649 Anemia, unspecified: Secondary | ICD-10-CM | POA: Diagnosis not present

## 2018-01-10 DIAGNOSIS — E785 Hyperlipidemia, unspecified: Secondary | ICD-10-CM | POA: Diagnosis not present

## 2018-01-24 DIAGNOSIS — G5790 Unspecified mononeuropathy of unspecified lower limb: Secondary | ICD-10-CM | POA: Diagnosis not present

## 2018-01-24 DIAGNOSIS — G894 Chronic pain syndrome: Secondary | ICD-10-CM | POA: Diagnosis not present

## 2018-01-24 DIAGNOSIS — M5416 Radiculopathy, lumbar region: Secondary | ICD-10-CM | POA: Diagnosis not present

## 2018-01-24 DIAGNOSIS — M792 Neuralgia and neuritis, unspecified: Secondary | ICD-10-CM | POA: Diagnosis not present

## 2018-01-31 DIAGNOSIS — M961 Postlaminectomy syndrome, not elsewhere classified: Secondary | ICD-10-CM | POA: Insufficient documentation

## 2018-02-11 DIAGNOSIS — E785 Hyperlipidemia, unspecified: Secondary | ICD-10-CM | POA: Diagnosis not present

## 2018-02-11 DIAGNOSIS — D649 Anemia, unspecified: Secondary | ICD-10-CM | POA: Diagnosis not present

## 2018-02-11 DIAGNOSIS — Z7901 Long term (current) use of anticoagulants: Secondary | ICD-10-CM | POA: Diagnosis not present

## 2018-02-11 DIAGNOSIS — M545 Low back pain: Secondary | ICD-10-CM | POA: Diagnosis not present

## 2018-02-14 DIAGNOSIS — D649 Anemia, unspecified: Secondary | ICD-10-CM | POA: Diagnosis not present

## 2018-02-14 DIAGNOSIS — E785 Hyperlipidemia, unspecified: Secondary | ICD-10-CM | POA: Diagnosis not present

## 2018-02-14 DIAGNOSIS — I82409 Acute embolism and thrombosis of unspecified deep veins of unspecified lower extremity: Secondary | ICD-10-CM | POA: Diagnosis not present

## 2018-02-14 DIAGNOSIS — J208 Acute bronchitis due to other specified organisms: Secondary | ICD-10-CM | POA: Diagnosis not present

## 2018-03-11 DIAGNOSIS — M545 Low back pain: Secondary | ICD-10-CM | POA: Diagnosis not present

## 2018-03-11 DIAGNOSIS — R791 Abnormal coagulation profile: Secondary | ICD-10-CM | POA: Diagnosis not present

## 2018-03-11 DIAGNOSIS — D649 Anemia, unspecified: Secondary | ICD-10-CM | POA: Diagnosis not present

## 2018-03-18 DIAGNOSIS — Z7901 Long term (current) use of anticoagulants: Secondary | ICD-10-CM | POA: Diagnosis not present

## 2018-04-10 DIAGNOSIS — M545 Low back pain: Secondary | ICD-10-CM | POA: Diagnosis not present

## 2018-04-10 DIAGNOSIS — R791 Abnormal coagulation profile: Secondary | ICD-10-CM | POA: Diagnosis not present

## 2018-04-10 DIAGNOSIS — E785 Hyperlipidemia, unspecified: Secondary | ICD-10-CM | POA: Diagnosis not present

## 2018-04-10 DIAGNOSIS — D649 Anemia, unspecified: Secondary | ICD-10-CM | POA: Diagnosis not present

## 2018-05-08 DIAGNOSIS — E785 Hyperlipidemia, unspecified: Secondary | ICD-10-CM | POA: Diagnosis not present

## 2018-05-08 DIAGNOSIS — M545 Low back pain: Secondary | ICD-10-CM | POA: Diagnosis not present

## 2018-05-08 DIAGNOSIS — Z7901 Long term (current) use of anticoagulants: Secondary | ICD-10-CM | POA: Diagnosis not present

## 2018-05-08 DIAGNOSIS — D649 Anemia, unspecified: Secondary | ICD-10-CM | POA: Diagnosis not present

## 2018-05-14 DIAGNOSIS — E785 Hyperlipidemia, unspecified: Secondary | ICD-10-CM | POA: Diagnosis not present

## 2018-05-14 DIAGNOSIS — D649 Anemia, unspecified: Secondary | ICD-10-CM | POA: Diagnosis not present

## 2018-05-14 DIAGNOSIS — I82409 Acute embolism and thrombosis of unspecified deep veins of unspecified lower extremity: Secondary | ICD-10-CM | POA: Diagnosis not present

## 2018-05-14 DIAGNOSIS — R600 Localized edema: Secondary | ICD-10-CM | POA: Diagnosis not present

## 2018-06-08 DIAGNOSIS — S22089A Unspecified fracture of T11-T12 vertebra, initial encounter for closed fracture: Secondary | ICD-10-CM | POA: Diagnosis not present

## 2018-06-08 DIAGNOSIS — S2232XA Fracture of one rib, left side, initial encounter for closed fracture: Secondary | ICD-10-CM | POA: Diagnosis not present

## 2018-06-08 DIAGNOSIS — S22080A Wedge compression fracture of T11-T12 vertebra, initial encounter for closed fracture: Secondary | ICD-10-CM | POA: Diagnosis not present

## 2018-06-08 DIAGNOSIS — M545 Low back pain: Secondary | ICD-10-CM | POA: Diagnosis not present

## 2018-06-08 DIAGNOSIS — R531 Weakness: Secondary | ICD-10-CM | POA: Diagnosis not present

## 2018-06-08 DIAGNOSIS — S299XXA Unspecified injury of thorax, initial encounter: Secondary | ICD-10-CM | POA: Diagnosis not present

## 2018-06-08 DIAGNOSIS — M546 Pain in thoracic spine: Secondary | ICD-10-CM | POA: Diagnosis not present

## 2018-06-09 DIAGNOSIS — M961 Postlaminectomy syndrome, not elsewhere classified: Secondary | ICD-10-CM | POA: Diagnosis not present

## 2018-06-11 DIAGNOSIS — Z7901 Long term (current) use of anticoagulants: Secondary | ICD-10-CM | POA: Diagnosis not present

## 2018-06-11 DIAGNOSIS — I2510 Atherosclerotic heart disease of native coronary artery without angina pectoris: Secondary | ICD-10-CM | POA: Diagnosis not present

## 2018-06-11 DIAGNOSIS — I82409 Acute embolism and thrombosis of unspecified deep veins of unspecified lower extremity: Secondary | ICD-10-CM | POA: Diagnosis not present

## 2018-06-11 DIAGNOSIS — E785 Hyperlipidemia, unspecified: Secondary | ICD-10-CM | POA: Diagnosis not present

## 2018-06-12 DIAGNOSIS — M25562 Pain in left knee: Secondary | ICD-10-CM | POA: Diagnosis not present

## 2018-06-30 ENCOUNTER — Other Ambulatory Visit: Payer: Self-pay | Admitting: Physician Assistant

## 2018-06-30 DIAGNOSIS — M25562 Pain in left knee: Secondary | ICD-10-CM

## 2018-07-03 DIAGNOSIS — I82409 Acute embolism and thrombosis of unspecified deep veins of unspecified lower extremity: Secondary | ICD-10-CM | POA: Diagnosis not present

## 2018-07-03 DIAGNOSIS — J208 Acute bronchitis due to other specified organisms: Secondary | ICD-10-CM | POA: Diagnosis not present

## 2018-07-03 DIAGNOSIS — R918 Other nonspecific abnormal finding of lung field: Secondary | ICD-10-CM | POA: Diagnosis not present

## 2018-07-03 DIAGNOSIS — E785 Hyperlipidemia, unspecified: Secondary | ICD-10-CM | POA: Diagnosis not present

## 2018-07-03 DIAGNOSIS — D649 Anemia, unspecified: Secondary | ICD-10-CM | POA: Diagnosis not present

## 2018-07-10 DIAGNOSIS — R791 Abnormal coagulation profile: Secondary | ICD-10-CM | POA: Diagnosis not present

## 2018-07-10 DIAGNOSIS — I82409 Acute embolism and thrombosis of unspecified deep veins of unspecified lower extremity: Secondary | ICD-10-CM | POA: Diagnosis not present

## 2018-07-10 DIAGNOSIS — E785 Hyperlipidemia, unspecified: Secondary | ICD-10-CM | POA: Diagnosis not present

## 2018-07-10 DIAGNOSIS — D649 Anemia, unspecified: Secondary | ICD-10-CM | POA: Diagnosis not present

## 2018-07-16 ENCOUNTER — Other Ambulatory Visit (HOSPITAL_COMMUNITY): Payer: Self-pay | Admitting: Physician Assistant

## 2018-07-16 DIAGNOSIS — M25562 Pain in left knee: Secondary | ICD-10-CM

## 2018-07-17 DIAGNOSIS — Z7901 Long term (current) use of anticoagulants: Secondary | ICD-10-CM | POA: Diagnosis not present

## 2018-07-21 ENCOUNTER — Encounter (HOSPITAL_COMMUNITY): Payer: Medicare Other

## 2018-07-24 ENCOUNTER — Other Ambulatory Visit: Payer: Self-pay

## 2018-07-31 DIAGNOSIS — Z7901 Long term (current) use of anticoagulants: Secondary | ICD-10-CM | POA: Diagnosis not present

## 2018-07-31 DIAGNOSIS — M25562 Pain in left knee: Secondary | ICD-10-CM | POA: Diagnosis not present

## 2018-07-31 DIAGNOSIS — T8484XD Pain due to internal orthopedic prosthetic devices, implants and grafts, subsequent encounter: Secondary | ICD-10-CM | POA: Diagnosis not present

## 2018-08-07 ENCOUNTER — Ambulatory Visit (HOSPITAL_COMMUNITY): Payer: Medicare Other | Attending: Physician Assistant

## 2018-08-07 ENCOUNTER — Encounter (HOSPITAL_COMMUNITY): Payer: Medicare Other

## 2018-08-11 DIAGNOSIS — M25562 Pain in left knee: Secondary | ICD-10-CM | POA: Diagnosis not present

## 2018-08-11 DIAGNOSIS — I82409 Acute embolism and thrombosis of unspecified deep veins of unspecified lower extremity: Secondary | ICD-10-CM | POA: Diagnosis not present

## 2018-08-11 DIAGNOSIS — Z7901 Long term (current) use of anticoagulants: Secondary | ICD-10-CM | POA: Diagnosis not present

## 2018-08-11 DIAGNOSIS — E785 Hyperlipidemia, unspecified: Secondary | ICD-10-CM | POA: Diagnosis not present

## 2018-08-18 DIAGNOSIS — I2510 Atherosclerotic heart disease of native coronary artery without angina pectoris: Secondary | ICD-10-CM | POA: Diagnosis not present

## 2018-08-18 DIAGNOSIS — M25551 Pain in right hip: Secondary | ICD-10-CM | POA: Diagnosis not present

## 2018-08-18 DIAGNOSIS — I82409 Acute embolism and thrombosis of unspecified deep veins of unspecified lower extremity: Secondary | ICD-10-CM | POA: Diagnosis not present

## 2018-08-18 DIAGNOSIS — E785 Hyperlipidemia, unspecified: Secondary | ICD-10-CM | POA: Diagnosis not present

## 2018-08-18 DIAGNOSIS — I10 Essential (primary) hypertension: Secondary | ICD-10-CM | POA: Diagnosis not present

## 2018-09-10 DIAGNOSIS — D649 Anemia, unspecified: Secondary | ICD-10-CM | POA: Diagnosis not present

## 2018-09-10 DIAGNOSIS — I82409 Acute embolism and thrombosis of unspecified deep veins of unspecified lower extremity: Secondary | ICD-10-CM | POA: Diagnosis not present

## 2018-09-10 DIAGNOSIS — R791 Abnormal coagulation profile: Secondary | ICD-10-CM | POA: Diagnosis not present

## 2018-09-10 DIAGNOSIS — E785 Hyperlipidemia, unspecified: Secondary | ICD-10-CM | POA: Diagnosis not present

## 2018-09-17 DIAGNOSIS — Z1231 Encounter for screening mammogram for malignant neoplasm of breast: Secondary | ICD-10-CM | POA: Diagnosis not present

## 2018-09-18 DIAGNOSIS — Z7901 Long term (current) use of anticoagulants: Secondary | ICD-10-CM | POA: Diagnosis not present

## 2018-09-18 DIAGNOSIS — J0100 Acute maxillary sinusitis, unspecified: Secondary | ICD-10-CM | POA: Diagnosis not present

## 2018-09-18 DIAGNOSIS — I82409 Acute embolism and thrombosis of unspecified deep veins of unspecified lower extremity: Secondary | ICD-10-CM | POA: Diagnosis not present

## 2018-09-18 DIAGNOSIS — E785 Hyperlipidemia, unspecified: Secondary | ICD-10-CM | POA: Diagnosis not present

## 2018-09-26 DIAGNOSIS — M1711 Unilateral primary osteoarthritis, right knee: Secondary | ICD-10-CM | POA: Diagnosis not present

## 2018-09-26 DIAGNOSIS — Z96652 Presence of left artificial knee joint: Secondary | ICD-10-CM | POA: Diagnosis not present

## 2018-09-26 DIAGNOSIS — M25562 Pain in left knee: Secondary | ICD-10-CM | POA: Diagnosis not present

## 2018-09-29 DIAGNOSIS — S8002XD Contusion of left knee, subsequent encounter: Secondary | ICD-10-CM | POA: Diagnosis not present

## 2018-09-29 DIAGNOSIS — M25562 Pain in left knee: Secondary | ICD-10-CM | POA: Diagnosis not present

## 2018-10-10 DIAGNOSIS — E785 Hyperlipidemia, unspecified: Secondary | ICD-10-CM | POA: Diagnosis not present

## 2018-10-10 DIAGNOSIS — Z0000 Encounter for general adult medical examination without abnormal findings: Secondary | ICD-10-CM | POA: Diagnosis not present

## 2018-10-10 DIAGNOSIS — I82409 Acute embolism and thrombosis of unspecified deep veins of unspecified lower extremity: Secondary | ICD-10-CM | POA: Diagnosis not present

## 2018-10-17 DIAGNOSIS — Z23 Encounter for immunization: Secondary | ICD-10-CM | POA: Diagnosis not present

## 2018-10-17 DIAGNOSIS — R791 Abnormal coagulation profile: Secondary | ICD-10-CM | POA: Diagnosis not present

## 2018-10-17 DIAGNOSIS — I82409 Acute embolism and thrombosis of unspecified deep veins of unspecified lower extremity: Secondary | ICD-10-CM | POA: Diagnosis not present

## 2018-10-17 DIAGNOSIS — E785 Hyperlipidemia, unspecified: Secondary | ICD-10-CM | POA: Diagnosis not present

## 2018-10-23 DIAGNOSIS — R791 Abnormal coagulation profile: Secondary | ICD-10-CM | POA: Diagnosis not present

## 2018-10-23 DIAGNOSIS — I82409 Acute embolism and thrombosis of unspecified deep veins of unspecified lower extremity: Secondary | ICD-10-CM | POA: Diagnosis not present

## 2018-10-23 DIAGNOSIS — E785 Hyperlipidemia, unspecified: Secondary | ICD-10-CM | POA: Diagnosis not present

## 2018-11-06 DIAGNOSIS — H5203 Hypermetropia, bilateral: Secondary | ICD-10-CM | POA: Diagnosis not present

## 2018-11-10 DIAGNOSIS — M159 Polyosteoarthritis, unspecified: Secondary | ICD-10-CM | POA: Diagnosis not present

## 2018-11-10 DIAGNOSIS — R791 Abnormal coagulation profile: Secondary | ICD-10-CM | POA: Diagnosis not present

## 2018-11-10 DIAGNOSIS — E785 Hyperlipidemia, unspecified: Secondary | ICD-10-CM | POA: Diagnosis not present

## 2018-11-10 DIAGNOSIS — M545 Low back pain: Secondary | ICD-10-CM | POA: Diagnosis not present

## 2018-11-24 DIAGNOSIS — M4326 Fusion of spine, lumbar region: Secondary | ICD-10-CM | POA: Diagnosis not present

## 2018-11-24 DIAGNOSIS — Z7902 Long term (current) use of antithrombotics/antiplatelets: Secondary | ICD-10-CM | POA: Diagnosis not present

## 2018-11-24 DIAGNOSIS — M545 Low back pain: Secondary | ICD-10-CM | POA: Diagnosis not present

## 2018-11-24 DIAGNOSIS — Z86711 Personal history of pulmonary embolism: Secondary | ICD-10-CM | POA: Diagnosis not present

## 2018-11-24 DIAGNOSIS — J449 Chronic obstructive pulmonary disease, unspecified: Secondary | ICD-10-CM | POA: Diagnosis not present

## 2018-11-24 DIAGNOSIS — Z7901 Long term (current) use of anticoagulants: Secondary | ICD-10-CM | POA: Diagnosis not present

## 2018-11-24 DIAGNOSIS — Z79891 Long term (current) use of opiate analgesic: Secondary | ICD-10-CM | POA: Diagnosis not present

## 2018-11-24 DIAGNOSIS — Z79899 Other long term (current) drug therapy: Secondary | ICD-10-CM | POA: Diagnosis not present

## 2018-11-24 DIAGNOSIS — I10 Essential (primary) hypertension: Secondary | ICD-10-CM | POA: Diagnosis not present

## 2018-11-24 DIAGNOSIS — I4891 Unspecified atrial fibrillation: Secondary | ICD-10-CM | POA: Diagnosis not present

## 2018-11-24 DIAGNOSIS — Z955 Presence of coronary angioplasty implant and graft: Secondary | ICD-10-CM | POA: Diagnosis not present

## 2018-11-24 DIAGNOSIS — R52 Pain, unspecified: Secondary | ICD-10-CM | POA: Diagnosis not present

## 2018-11-24 DIAGNOSIS — I2510 Atherosclerotic heart disease of native coronary artery without angina pectoris: Secondary | ICD-10-CM | POA: Diagnosis not present

## 2018-12-10 DIAGNOSIS — M545 Low back pain: Secondary | ICD-10-CM | POA: Diagnosis not present

## 2018-12-10 DIAGNOSIS — R791 Abnormal coagulation profile: Secondary | ICD-10-CM | POA: Diagnosis not present

## 2018-12-10 DIAGNOSIS — M159 Polyosteoarthritis, unspecified: Secondary | ICD-10-CM | POA: Diagnosis not present

## 2018-12-10 DIAGNOSIS — E785 Hyperlipidemia, unspecified: Secondary | ICD-10-CM | POA: Diagnosis not present

## 2018-12-27 DIAGNOSIS — I82409 Acute embolism and thrombosis of unspecified deep veins of unspecified lower extremity: Secondary | ICD-10-CM | POA: Diagnosis not present

## 2018-12-27 DIAGNOSIS — I10 Essential (primary) hypertension: Secondary | ICD-10-CM | POA: Diagnosis not present

## 2018-12-27 DIAGNOSIS — J208 Acute bronchitis due to other specified organisms: Secondary | ICD-10-CM | POA: Diagnosis not present

## 2018-12-27 DIAGNOSIS — E785 Hyperlipidemia, unspecified: Secondary | ICD-10-CM | POA: Diagnosis not present

## 2018-12-27 DIAGNOSIS — R791 Abnormal coagulation profile: Secondary | ICD-10-CM | POA: Diagnosis not present

## 2018-12-31 DIAGNOSIS — M25562 Pain in left knee: Secondary | ICD-10-CM | POA: Diagnosis not present

## 2018-12-31 DIAGNOSIS — M25561 Pain in right knee: Secondary | ICD-10-CM | POA: Diagnosis not present

## 2018-12-31 DIAGNOSIS — G8929 Other chronic pain: Secondary | ICD-10-CM | POA: Diagnosis not present

## 2018-12-31 DIAGNOSIS — M5416 Radiculopathy, lumbar region: Secondary | ICD-10-CM | POA: Diagnosis not present

## 2018-12-31 DIAGNOSIS — Z79899 Other long term (current) drug therapy: Secondary | ICD-10-CM | POA: Diagnosis not present

## 2019-01-06 DIAGNOSIS — D539 Nutritional anemia, unspecified: Secondary | ICD-10-CM | POA: Diagnosis not present

## 2019-01-06 DIAGNOSIS — Z79899 Other long term (current) drug therapy: Secondary | ICD-10-CM | POA: Diagnosis not present

## 2019-01-06 DIAGNOSIS — D538 Other specified nutritional anemias: Secondary | ICD-10-CM | POA: Diagnosis not present

## 2019-01-06 DIAGNOSIS — M545 Low back pain: Secondary | ICD-10-CM | POA: Diagnosis not present

## 2019-01-06 DIAGNOSIS — E559 Vitamin D deficiency, unspecified: Secondary | ICD-10-CM | POA: Diagnosis not present

## 2019-01-09 DIAGNOSIS — I82409 Acute embolism and thrombosis of unspecified deep veins of unspecified lower extremity: Secondary | ICD-10-CM | POA: Diagnosis not present

## 2019-01-09 DIAGNOSIS — M159 Polyosteoarthritis, unspecified: Secondary | ICD-10-CM | POA: Diagnosis not present

## 2019-01-09 DIAGNOSIS — E785 Hyperlipidemia, unspecified: Secondary | ICD-10-CM | POA: Diagnosis not present

## 2019-01-09 DIAGNOSIS — Z7901 Long term (current) use of anticoagulants: Secondary | ICD-10-CM | POA: Diagnosis not present

## 2019-01-20 DIAGNOSIS — D509 Iron deficiency anemia, unspecified: Secondary | ICD-10-CM | POA: Diagnosis not present

## 2019-01-20 DIAGNOSIS — M545 Low back pain: Secondary | ICD-10-CM | POA: Diagnosis not present

## 2019-01-20 DIAGNOSIS — Z79891 Long term (current) use of opiate analgesic: Secondary | ICD-10-CM | POA: Diagnosis not present

## 2019-02-11 DIAGNOSIS — E785 Hyperlipidemia, unspecified: Secondary | ICD-10-CM | POA: Diagnosis not present

## 2019-02-11 DIAGNOSIS — M159 Polyosteoarthritis, unspecified: Secondary | ICD-10-CM | POA: Diagnosis not present

## 2019-02-11 DIAGNOSIS — R791 Abnormal coagulation profile: Secondary | ICD-10-CM | POA: Diagnosis not present

## 2019-02-18 DIAGNOSIS — M549 Dorsalgia, unspecified: Secondary | ICD-10-CM | POA: Diagnosis not present

## 2019-02-18 DIAGNOSIS — M25562 Pain in left knee: Secondary | ICD-10-CM | POA: Diagnosis not present

## 2019-02-18 DIAGNOSIS — G8929 Other chronic pain: Secondary | ICD-10-CM | POA: Diagnosis not present

## 2019-02-18 DIAGNOSIS — M25561 Pain in right knee: Secondary | ICD-10-CM | POA: Diagnosis not present

## 2019-02-18 DIAGNOSIS — Z79899 Other long term (current) drug therapy: Secondary | ICD-10-CM | POA: Diagnosis not present

## 2019-02-24 DIAGNOSIS — E785 Hyperlipidemia, unspecified: Secondary | ICD-10-CM | POA: Diagnosis not present

## 2019-02-24 DIAGNOSIS — R791 Abnormal coagulation profile: Secondary | ICD-10-CM | POA: Diagnosis not present

## 2019-02-24 DIAGNOSIS — M159 Polyosteoarthritis, unspecified: Secondary | ICD-10-CM | POA: Diagnosis not present

## 2019-03-03 DIAGNOSIS — R791 Abnormal coagulation profile: Secondary | ICD-10-CM | POA: Diagnosis not present

## 2019-03-03 DIAGNOSIS — E785 Hyperlipidemia, unspecified: Secondary | ICD-10-CM | POA: Diagnosis not present

## 2019-03-03 DIAGNOSIS — M159 Polyosteoarthritis, unspecified: Secondary | ICD-10-CM | POA: Diagnosis not present

## 2019-03-11 DIAGNOSIS — M159 Polyosteoarthritis, unspecified: Secondary | ICD-10-CM | POA: Diagnosis not present

## 2019-03-11 DIAGNOSIS — E785 Hyperlipidemia, unspecified: Secondary | ICD-10-CM | POA: Diagnosis not present

## 2019-03-11 DIAGNOSIS — Z7901 Long term (current) use of anticoagulants: Secondary | ICD-10-CM | POA: Diagnosis not present

## 2019-03-17 DIAGNOSIS — D699 Hemorrhagic condition, unspecified: Secondary | ICD-10-CM | POA: Diagnosis not present

## 2019-03-17 DIAGNOSIS — G8929 Other chronic pain: Secondary | ICD-10-CM | POA: Diagnosis not present

## 2019-03-17 DIAGNOSIS — Z79899 Other long term (current) drug therapy: Secondary | ICD-10-CM | POA: Diagnosis not present

## 2019-03-17 DIAGNOSIS — M549 Dorsalgia, unspecified: Secondary | ICD-10-CM | POA: Diagnosis not present

## 2019-03-19 DIAGNOSIS — M159 Polyosteoarthritis, unspecified: Secondary | ICD-10-CM | POA: Diagnosis not present

## 2019-03-19 DIAGNOSIS — J449 Chronic obstructive pulmonary disease, unspecified: Secondary | ICD-10-CM | POA: Diagnosis not present

## 2019-03-19 DIAGNOSIS — E785 Hyperlipidemia, unspecified: Secondary | ICD-10-CM | POA: Diagnosis not present

## 2019-03-19 DIAGNOSIS — R791 Abnormal coagulation profile: Secondary | ICD-10-CM | POA: Diagnosis not present

## 2019-03-20 DIAGNOSIS — R52 Pain, unspecified: Secondary | ICD-10-CM | POA: Diagnosis not present

## 2019-03-20 DIAGNOSIS — S42292A Other displaced fracture of upper end of left humerus, initial encounter for closed fracture: Secondary | ICD-10-CM | POA: Diagnosis not present

## 2019-03-20 DIAGNOSIS — R0789 Other chest pain: Secondary | ICD-10-CM | POA: Diagnosis not present

## 2019-03-20 DIAGNOSIS — S42215A Unspecified nondisplaced fracture of surgical neck of left humerus, initial encounter for closed fracture: Secondary | ICD-10-CM | POA: Diagnosis not present

## 2019-03-20 DIAGNOSIS — M25519 Pain in unspecified shoulder: Secondary | ICD-10-CM | POA: Diagnosis not present

## 2019-03-20 DIAGNOSIS — S79912A Unspecified injury of left hip, initial encounter: Secondary | ICD-10-CM | POA: Diagnosis not present

## 2019-03-20 DIAGNOSIS — S59902A Unspecified injury of left elbow, initial encounter: Secondary | ICD-10-CM | POA: Diagnosis not present

## 2019-03-20 DIAGNOSIS — M25552 Pain in left hip: Secondary | ICD-10-CM | POA: Diagnosis not present

## 2019-03-20 DIAGNOSIS — R0902 Hypoxemia: Secondary | ICD-10-CM | POA: Diagnosis not present

## 2019-03-20 DIAGNOSIS — S42202A Unspecified fracture of upper end of left humerus, initial encounter for closed fracture: Secondary | ICD-10-CM | POA: Diagnosis not present

## 2019-03-20 DIAGNOSIS — W19XXXA Unspecified fall, initial encounter: Secondary | ICD-10-CM | POA: Diagnosis not present

## 2019-03-20 DIAGNOSIS — S299XXA Unspecified injury of thorax, initial encounter: Secondary | ICD-10-CM | POA: Diagnosis not present

## 2019-03-20 DIAGNOSIS — M79603 Pain in arm, unspecified: Secondary | ICD-10-CM | POA: Diagnosis not present

## 2019-03-23 DIAGNOSIS — S42302A Unspecified fracture of shaft of humerus, left arm, initial encounter for closed fracture: Secondary | ICD-10-CM | POA: Diagnosis not present

## 2019-03-23 DIAGNOSIS — M159 Polyosteoarthritis, unspecified: Secondary | ICD-10-CM | POA: Diagnosis not present

## 2019-03-23 DIAGNOSIS — E785 Hyperlipidemia, unspecified: Secondary | ICD-10-CM | POA: Diagnosis not present

## 2019-03-25 DIAGNOSIS — S42212A Unspecified displaced fracture of surgical neck of left humerus, initial encounter for closed fracture: Secondary | ICD-10-CM | POA: Diagnosis not present

## 2019-03-25 DIAGNOSIS — S42252A Displaced fracture of greater tuberosity of left humerus, initial encounter for closed fracture: Secondary | ICD-10-CM | POA: Diagnosis not present

## 2019-03-26 DIAGNOSIS — S42202A Unspecified fracture of upper end of left humerus, initial encounter for closed fracture: Secondary | ICD-10-CM | POA: Diagnosis not present

## 2019-03-30 DIAGNOSIS — I2510 Atherosclerotic heart disease of native coronary artery without angina pectoris: Secondary | ICD-10-CM | POA: Diagnosis not present

## 2019-03-30 DIAGNOSIS — E785 Hyperlipidemia, unspecified: Secondary | ICD-10-CM | POA: Diagnosis not present

## 2019-03-30 DIAGNOSIS — M159 Polyosteoarthritis, unspecified: Secondary | ICD-10-CM | POA: Diagnosis not present

## 2019-03-30 DIAGNOSIS — I10 Essential (primary) hypertension: Secondary | ICD-10-CM | POA: Diagnosis not present

## 2019-04-06 DIAGNOSIS — M159 Polyosteoarthritis, unspecified: Secondary | ICD-10-CM | POA: Diagnosis not present

## 2019-04-06 DIAGNOSIS — I2510 Atherosclerotic heart disease of native coronary artery without angina pectoris: Secondary | ICD-10-CM | POA: Diagnosis not present

## 2019-04-06 DIAGNOSIS — Z01810 Encounter for preprocedural cardiovascular examination: Secondary | ICD-10-CM | POA: Diagnosis not present

## 2019-04-10 DIAGNOSIS — Z01818 Encounter for other preprocedural examination: Secondary | ICD-10-CM | POA: Diagnosis not present

## 2019-04-10 DIAGNOSIS — Z7901 Long term (current) use of anticoagulants: Secondary | ICD-10-CM | POA: Insufficient documentation

## 2019-04-10 DIAGNOSIS — Z955 Presence of coronary angioplasty implant and graft: Secondary | ICD-10-CM | POA: Insufficient documentation

## 2019-04-10 DIAGNOSIS — D6851 Activated protein C resistance: Secondary | ICD-10-CM | POA: Insufficient documentation

## 2019-04-10 DIAGNOSIS — I10 Essential (primary) hypertension: Secondary | ICD-10-CM | POA: Insufficient documentation

## 2019-04-10 DIAGNOSIS — Z86718 Personal history of other venous thrombosis and embolism: Secondary | ICD-10-CM | POA: Insufficient documentation

## 2019-04-10 DIAGNOSIS — I2510 Atherosclerotic heart disease of native coronary artery without angina pectoris: Secondary | ICD-10-CM | POA: Diagnosis not present

## 2019-04-11 DIAGNOSIS — R9431 Abnormal electrocardiogram [ECG] [EKG]: Secondary | ICD-10-CM | POA: Diagnosis not present

## 2019-04-14 DIAGNOSIS — Z79891 Long term (current) use of opiate analgesic: Secondary | ICD-10-CM | POA: Diagnosis not present

## 2019-04-14 DIAGNOSIS — Z79899 Other long term (current) drug therapy: Secondary | ICD-10-CM | POA: Diagnosis not present

## 2019-04-14 DIAGNOSIS — S472XXS Crushing injury of left shoulder and upper arm, sequela: Secondary | ICD-10-CM | POA: Diagnosis not present

## 2019-04-14 DIAGNOSIS — R0602 Shortness of breath: Secondary | ICD-10-CM | POA: Diagnosis not present

## 2019-04-14 DIAGNOSIS — M5416 Radiculopathy, lumbar region: Secondary | ICD-10-CM | POA: Diagnosis not present

## 2019-04-16 DIAGNOSIS — Z01810 Encounter for preprocedural cardiovascular examination: Secondary | ICD-10-CM | POA: Diagnosis not present

## 2019-04-16 DIAGNOSIS — Z7901 Long term (current) use of anticoagulants: Secondary | ICD-10-CM | POA: Diagnosis not present

## 2019-04-16 DIAGNOSIS — M159 Polyosteoarthritis, unspecified: Secondary | ICD-10-CM | POA: Diagnosis not present

## 2019-04-23 DIAGNOSIS — M159 Polyosteoarthritis, unspecified: Secondary | ICD-10-CM | POA: Diagnosis not present

## 2019-04-23 DIAGNOSIS — I10 Essential (primary) hypertension: Secondary | ICD-10-CM | POA: Diagnosis not present

## 2019-04-23 DIAGNOSIS — J449 Chronic obstructive pulmonary disease, unspecified: Secondary | ICD-10-CM | POA: Diagnosis not present

## 2019-04-27 DIAGNOSIS — D6851 Activated protein C resistance: Secondary | ICD-10-CM

## 2019-04-27 DIAGNOSIS — Z7901 Long term (current) use of anticoagulants: Secondary | ICD-10-CM

## 2019-05-05 DIAGNOSIS — M79661 Pain in right lower leg: Secondary | ICD-10-CM | POA: Diagnosis not present

## 2019-05-05 DIAGNOSIS — I10 Essential (primary) hypertension: Secondary | ICD-10-CM | POA: Diagnosis not present

## 2019-05-05 DIAGNOSIS — R2243 Localized swelling, mass and lump, lower limb, bilateral: Secondary | ICD-10-CM | POA: Diagnosis not present

## 2019-05-05 DIAGNOSIS — I4891 Unspecified atrial fibrillation: Secondary | ICD-10-CM | POA: Diagnosis not present

## 2019-05-05 DIAGNOSIS — Z86711 Personal history of pulmonary embolism: Secondary | ICD-10-CM | POA: Diagnosis not present

## 2019-05-05 DIAGNOSIS — Z9861 Coronary angioplasty status: Secondary | ICD-10-CM | POA: Diagnosis not present

## 2019-05-05 DIAGNOSIS — M7989 Other specified soft tissue disorders: Secondary | ICD-10-CM | POA: Diagnosis not present

## 2019-05-05 DIAGNOSIS — Z7901 Long term (current) use of anticoagulants: Secondary | ICD-10-CM | POA: Diagnosis not present

## 2019-05-05 DIAGNOSIS — I2510 Atherosclerotic heart disease of native coronary artery without angina pectoris: Secondary | ICD-10-CM | POA: Diagnosis not present

## 2019-05-05 DIAGNOSIS — R600 Localized edema: Secondary | ICD-10-CM | POA: Diagnosis not present

## 2019-05-05 DIAGNOSIS — Z79899 Other long term (current) drug therapy: Secondary | ICD-10-CM | POA: Diagnosis not present

## 2019-05-05 DIAGNOSIS — M1990 Unspecified osteoarthritis, unspecified site: Secondary | ICD-10-CM | POA: Diagnosis not present

## 2019-05-05 DIAGNOSIS — Z79891 Long term (current) use of opiate analgesic: Secondary | ICD-10-CM | POA: Diagnosis not present

## 2019-05-05 DIAGNOSIS — S42202A Unspecified fracture of upper end of left humerus, initial encounter for closed fracture: Secondary | ICD-10-CM | POA: Diagnosis not present

## 2019-05-05 DIAGNOSIS — M25512 Pain in left shoulder: Secondary | ICD-10-CM | POA: Diagnosis not present

## 2019-05-05 DIAGNOSIS — M79662 Pain in left lower leg: Secondary | ICD-10-CM | POA: Diagnosis not present

## 2019-05-08 DIAGNOSIS — I10 Essential (primary) hypertension: Secondary | ICD-10-CM | POA: Diagnosis not present

## 2019-05-08 DIAGNOSIS — M159 Polyosteoarthritis, unspecified: Secondary | ICD-10-CM | POA: Diagnosis not present

## 2019-05-08 DIAGNOSIS — I2510 Atherosclerotic heart disease of native coronary artery without angina pectoris: Secondary | ICD-10-CM | POA: Diagnosis not present

## 2019-05-11 DIAGNOSIS — M25562 Pain in left knee: Secondary | ICD-10-CM | POA: Diagnosis not present

## 2019-05-11 DIAGNOSIS — Z79891 Long term (current) use of opiate analgesic: Secondary | ICD-10-CM | POA: Diagnosis not present

## 2019-05-11 DIAGNOSIS — Z79899 Other long term (current) drug therapy: Secondary | ICD-10-CM | POA: Diagnosis not present

## 2019-05-11 DIAGNOSIS — M5416 Radiculopathy, lumbar region: Secondary | ICD-10-CM | POA: Diagnosis not present

## 2019-05-12 DIAGNOSIS — Z96611 Presence of right artificial shoulder joint: Secondary | ICD-10-CM | POA: Diagnosis not present

## 2019-05-12 DIAGNOSIS — K219 Gastro-esophageal reflux disease without esophagitis: Secondary | ICD-10-CM | POA: Diagnosis not present

## 2019-05-12 DIAGNOSIS — G4700 Insomnia, unspecified: Secondary | ICD-10-CM | POA: Diagnosis not present

## 2019-05-12 DIAGNOSIS — Z86711 Personal history of pulmonary embolism: Secondary | ICD-10-CM | POA: Diagnosis not present

## 2019-05-12 DIAGNOSIS — Z955 Presence of coronary angioplasty implant and graft: Secondary | ICD-10-CM | POA: Diagnosis not present

## 2019-05-12 DIAGNOSIS — Z7901 Long term (current) use of anticoagulants: Secondary | ICD-10-CM | POA: Diagnosis not present

## 2019-05-12 DIAGNOSIS — I10 Essential (primary) hypertension: Secondary | ICD-10-CM | POA: Diagnosis not present

## 2019-05-12 DIAGNOSIS — Z79891 Long term (current) use of opiate analgesic: Secondary | ICD-10-CM | POA: Diagnosis not present

## 2019-05-12 DIAGNOSIS — J449 Chronic obstructive pulmonary disease, unspecified: Secondary | ICD-10-CM | POA: Diagnosis not present

## 2019-05-12 DIAGNOSIS — I252 Old myocardial infarction: Secondary | ICD-10-CM | POA: Diagnosis not present

## 2019-05-12 DIAGNOSIS — Z23 Encounter for immunization: Secondary | ICD-10-CM | POA: Diagnosis not present

## 2019-05-12 DIAGNOSIS — Z86718 Personal history of other venous thrombosis and embolism: Secondary | ICD-10-CM | POA: Diagnosis not present

## 2019-05-12 DIAGNOSIS — M84622A Pathological fracture in other disease, left humerus, initial encounter for fracture: Secondary | ICD-10-CM | POA: Diagnosis not present

## 2019-05-12 DIAGNOSIS — G8918 Other acute postprocedural pain: Secondary | ICD-10-CM | POA: Diagnosis not present

## 2019-05-12 DIAGNOSIS — M1712 Unilateral primary osteoarthritis, left knee: Secondary | ICD-10-CM | POA: Diagnosis not present

## 2019-05-12 DIAGNOSIS — M19012 Primary osteoarthritis, left shoulder: Secondary | ICD-10-CM | POA: Diagnosis not present

## 2019-05-12 DIAGNOSIS — G8929 Other chronic pain: Secondary | ICD-10-CM | POA: Diagnosis not present

## 2019-05-12 DIAGNOSIS — Z471 Aftercare following joint replacement surgery: Secondary | ICD-10-CM | POA: Diagnosis not present

## 2019-05-12 DIAGNOSIS — S42202A Unspecified fracture of upper end of left humerus, initial encounter for closed fracture: Secondary | ICD-10-CM | POA: Diagnosis not present

## 2019-05-12 DIAGNOSIS — Z79899 Other long term (current) drug therapy: Secondary | ICD-10-CM | POA: Diagnosis not present

## 2019-05-12 DIAGNOSIS — E785 Hyperlipidemia, unspecified: Secondary | ICD-10-CM | POA: Diagnosis not present

## 2019-05-12 DIAGNOSIS — S42242P 4-part fracture of surgical neck of left humerus, subsequent encounter for fracture with malunion: Secondary | ICD-10-CM | POA: Diagnosis not present

## 2019-05-13 DIAGNOSIS — Z955 Presence of coronary angioplasty implant and graft: Secondary | ICD-10-CM | POA: Diagnosis not present

## 2019-05-13 DIAGNOSIS — I252 Old myocardial infarction: Secondary | ICD-10-CM | POA: Diagnosis not present

## 2019-05-13 DIAGNOSIS — G8929 Other chronic pain: Secondary | ICD-10-CM | POA: Diagnosis not present

## 2019-05-13 DIAGNOSIS — I10 Essential (primary) hypertension: Secondary | ICD-10-CM | POA: Diagnosis not present

## 2019-05-13 DIAGNOSIS — E785 Hyperlipidemia, unspecified: Secondary | ICD-10-CM | POA: Diagnosis not present

## 2019-05-13 DIAGNOSIS — K219 Gastro-esophageal reflux disease without esophagitis: Secondary | ICD-10-CM | POA: Diagnosis not present

## 2019-05-13 DIAGNOSIS — Z86711 Personal history of pulmonary embolism: Secondary | ICD-10-CM | POA: Diagnosis not present

## 2019-05-13 DIAGNOSIS — Z23 Encounter for immunization: Secondary | ICD-10-CM | POA: Diagnosis not present

## 2019-05-13 DIAGNOSIS — S42202A Unspecified fracture of upper end of left humerus, initial encounter for closed fracture: Secondary | ICD-10-CM | POA: Diagnosis not present

## 2019-05-13 DIAGNOSIS — Z7901 Long term (current) use of anticoagulants: Secondary | ICD-10-CM | POA: Diagnosis not present

## 2019-05-13 DIAGNOSIS — Z86718 Personal history of other venous thrombosis and embolism: Secondary | ICD-10-CM | POA: Diagnosis not present

## 2019-05-13 DIAGNOSIS — G4700 Insomnia, unspecified: Secondary | ICD-10-CM | POA: Diagnosis not present

## 2019-05-13 DIAGNOSIS — Z79891 Long term (current) use of opiate analgesic: Secondary | ICD-10-CM | POA: Diagnosis not present

## 2019-05-13 DIAGNOSIS — M1712 Unilateral primary osteoarthritis, left knee: Secondary | ICD-10-CM | POA: Diagnosis not present

## 2019-05-13 DIAGNOSIS — Z79899 Other long term (current) drug therapy: Secondary | ICD-10-CM | POA: Diagnosis not present

## 2019-05-15 DIAGNOSIS — D649 Anemia, unspecified: Secondary | ICD-10-CM | POA: Diagnosis not present

## 2019-05-15 DIAGNOSIS — I2510 Atherosclerotic heart disease of native coronary artery without angina pectoris: Secondary | ICD-10-CM | POA: Diagnosis not present

## 2019-05-15 DIAGNOSIS — I82409 Acute embolism and thrombosis of unspecified deep veins of unspecified lower extremity: Secondary | ICD-10-CM | POA: Diagnosis not present

## 2019-05-19 DIAGNOSIS — M25612 Stiffness of left shoulder, not elsewhere classified: Secondary | ICD-10-CM | POA: Diagnosis not present

## 2019-05-19 DIAGNOSIS — M25512 Pain in left shoulder: Secondary | ICD-10-CM | POA: Diagnosis not present

## 2019-05-19 DIAGNOSIS — M6281 Muscle weakness (generalized): Secondary | ICD-10-CM | POA: Diagnosis not present

## 2019-05-19 DIAGNOSIS — Z96612 Presence of left artificial shoulder joint: Secondary | ICD-10-CM | POA: Diagnosis not present

## 2019-05-21 DIAGNOSIS — M6281 Muscle weakness (generalized): Secondary | ICD-10-CM | POA: Diagnosis not present

## 2019-05-21 DIAGNOSIS — M25612 Stiffness of left shoulder, not elsewhere classified: Secondary | ICD-10-CM | POA: Diagnosis not present

## 2019-05-21 DIAGNOSIS — Z96612 Presence of left artificial shoulder joint: Secondary | ICD-10-CM | POA: Diagnosis not present

## 2019-05-21 DIAGNOSIS — M25512 Pain in left shoulder: Secondary | ICD-10-CM | POA: Diagnosis not present

## 2019-05-25 DIAGNOSIS — M25612 Stiffness of left shoulder, not elsewhere classified: Secondary | ICD-10-CM | POA: Diagnosis not present

## 2019-05-25 DIAGNOSIS — M25512 Pain in left shoulder: Secondary | ICD-10-CM | POA: Diagnosis not present

## 2019-05-25 DIAGNOSIS — Z96612 Presence of left artificial shoulder joint: Secondary | ICD-10-CM | POA: Diagnosis not present

## 2019-05-25 DIAGNOSIS — M6281 Muscle weakness (generalized): Secondary | ICD-10-CM | POA: Diagnosis not present

## 2019-05-28 DIAGNOSIS — I82409 Acute embolism and thrombosis of unspecified deep veins of unspecified lower extremity: Secondary | ICD-10-CM | POA: Diagnosis not present

## 2019-05-28 DIAGNOSIS — M25612 Stiffness of left shoulder, not elsewhere classified: Secondary | ICD-10-CM | POA: Diagnosis not present

## 2019-05-28 DIAGNOSIS — D649 Anemia, unspecified: Secondary | ICD-10-CM | POA: Diagnosis not present

## 2019-05-28 DIAGNOSIS — I2510 Atherosclerotic heart disease of native coronary artery without angina pectoris: Secondary | ICD-10-CM | POA: Diagnosis not present

## 2019-05-28 DIAGNOSIS — Z96612 Presence of left artificial shoulder joint: Secondary | ICD-10-CM | POA: Diagnosis not present

## 2019-05-28 DIAGNOSIS — M25512 Pain in left shoulder: Secondary | ICD-10-CM | POA: Diagnosis not present

## 2019-05-28 DIAGNOSIS — M6281 Muscle weakness (generalized): Secondary | ICD-10-CM | POA: Diagnosis not present

## 2019-06-02 DIAGNOSIS — M25612 Stiffness of left shoulder, not elsewhere classified: Secondary | ICD-10-CM | POA: Diagnosis not present

## 2019-06-02 DIAGNOSIS — M25512 Pain in left shoulder: Secondary | ICD-10-CM | POA: Diagnosis not present

## 2019-06-02 DIAGNOSIS — M6281 Muscle weakness (generalized): Secondary | ICD-10-CM | POA: Diagnosis not present

## 2019-06-02 DIAGNOSIS — Z96612 Presence of left artificial shoulder joint: Secondary | ICD-10-CM | POA: Diagnosis not present

## 2019-06-09 DIAGNOSIS — I82409 Acute embolism and thrombosis of unspecified deep veins of unspecified lower extremity: Secondary | ICD-10-CM | POA: Diagnosis not present

## 2019-06-09 DIAGNOSIS — I2510 Atherosclerotic heart disease of native coronary artery without angina pectoris: Secondary | ICD-10-CM | POA: Diagnosis not present

## 2019-06-09 DIAGNOSIS — Z9861 Coronary angioplasty status: Secondary | ICD-10-CM | POA: Diagnosis not present

## 2019-06-10 DIAGNOSIS — Z1159 Encounter for screening for other viral diseases: Secondary | ICD-10-CM | POA: Diagnosis not present

## 2019-06-10 DIAGNOSIS — M25512 Pain in left shoulder: Secondary | ICD-10-CM | POA: Diagnosis not present

## 2019-06-10 DIAGNOSIS — M5416 Radiculopathy, lumbar region: Secondary | ICD-10-CM | POA: Diagnosis not present

## 2019-06-10 DIAGNOSIS — Z79899 Other long term (current) drug therapy: Secondary | ICD-10-CM | POA: Diagnosis not present

## 2019-06-10 DIAGNOSIS — Z79891 Long term (current) use of opiate analgesic: Secondary | ICD-10-CM | POA: Diagnosis not present

## 2019-06-17 DIAGNOSIS — D649 Anemia, unspecified: Secondary | ICD-10-CM | POA: Diagnosis not present

## 2019-06-17 DIAGNOSIS — I2510 Atherosclerotic heart disease of native coronary artery without angina pectoris: Secondary | ICD-10-CM | POA: Diagnosis not present

## 2019-06-17 DIAGNOSIS — I82409 Acute embolism and thrombosis of unspecified deep veins of unspecified lower extremity: Secondary | ICD-10-CM | POA: Diagnosis not present

## 2019-06-30 DIAGNOSIS — M6281 Muscle weakness (generalized): Secondary | ICD-10-CM | POA: Diagnosis not present

## 2019-06-30 DIAGNOSIS — I2510 Atherosclerotic heart disease of native coronary artery without angina pectoris: Secondary | ICD-10-CM | POA: Diagnosis not present

## 2019-06-30 DIAGNOSIS — M25551 Pain in right hip: Secondary | ICD-10-CM | POA: Diagnosis not present

## 2019-06-30 DIAGNOSIS — M25512 Pain in left shoulder: Secondary | ICD-10-CM | POA: Diagnosis not present

## 2019-06-30 DIAGNOSIS — Z9861 Coronary angioplasty status: Secondary | ICD-10-CM | POA: Diagnosis not present

## 2019-06-30 DIAGNOSIS — Z96612 Presence of left artificial shoulder joint: Secondary | ICD-10-CM | POA: Diagnosis not present

## 2019-06-30 DIAGNOSIS — M25612 Stiffness of left shoulder, not elsewhere classified: Secondary | ICD-10-CM | POA: Diagnosis not present

## 2019-07-02 DIAGNOSIS — M6281 Muscle weakness (generalized): Secondary | ICD-10-CM | POA: Diagnosis not present

## 2019-07-02 DIAGNOSIS — M25512 Pain in left shoulder: Secondary | ICD-10-CM | POA: Diagnosis not present

## 2019-07-02 DIAGNOSIS — M25612 Stiffness of left shoulder, not elsewhere classified: Secondary | ICD-10-CM | POA: Diagnosis not present

## 2019-07-02 DIAGNOSIS — Z96612 Presence of left artificial shoulder joint: Secondary | ICD-10-CM | POA: Diagnosis not present

## 2019-07-03 DIAGNOSIS — S52122A Displaced fracture of head of left radius, initial encounter for closed fracture: Secondary | ICD-10-CM | POA: Diagnosis not present

## 2019-07-09 DIAGNOSIS — D649 Anemia, unspecified: Secondary | ICD-10-CM | POA: Diagnosis not present

## 2019-07-09 DIAGNOSIS — I2510 Atherosclerotic heart disease of native coronary artery without angina pectoris: Secondary | ICD-10-CM | POA: Diagnosis not present

## 2019-07-09 DIAGNOSIS — M25551 Pain in right hip: Secondary | ICD-10-CM | POA: Diagnosis not present

## 2019-07-10 DIAGNOSIS — Z79899 Other long term (current) drug therapy: Secondary | ICD-10-CM | POA: Diagnosis not present

## 2019-07-10 DIAGNOSIS — G8929 Other chronic pain: Secondary | ICD-10-CM | POA: Diagnosis not present

## 2019-07-10 DIAGNOSIS — M25562 Pain in left knee: Secondary | ICD-10-CM | POA: Diagnosis not present

## 2019-07-16 DIAGNOSIS — M6281 Muscle weakness (generalized): Secondary | ICD-10-CM | POA: Diagnosis not present

## 2019-07-16 DIAGNOSIS — M25612 Stiffness of left shoulder, not elsewhere classified: Secondary | ICD-10-CM | POA: Diagnosis not present

## 2019-07-16 DIAGNOSIS — Z96612 Presence of left artificial shoulder joint: Secondary | ICD-10-CM | POA: Diagnosis not present

## 2019-07-16 DIAGNOSIS — M25512 Pain in left shoulder: Secondary | ICD-10-CM | POA: Diagnosis not present

## 2019-07-22 DIAGNOSIS — Z96612 Presence of left artificial shoulder joint: Secondary | ICD-10-CM | POA: Diagnosis not present

## 2019-07-22 DIAGNOSIS — M25512 Pain in left shoulder: Secondary | ICD-10-CM | POA: Diagnosis not present

## 2019-07-22 DIAGNOSIS — M25612 Stiffness of left shoulder, not elsewhere classified: Secondary | ICD-10-CM | POA: Diagnosis not present

## 2019-07-22 DIAGNOSIS — M6281 Muscle weakness (generalized): Secondary | ICD-10-CM | POA: Diagnosis not present

## 2019-07-24 DIAGNOSIS — Z96612 Presence of left artificial shoulder joint: Secondary | ICD-10-CM | POA: Diagnosis not present

## 2019-07-24 DIAGNOSIS — M25512 Pain in left shoulder: Secondary | ICD-10-CM | POA: Diagnosis not present

## 2019-07-24 DIAGNOSIS — M6281 Muscle weakness (generalized): Secondary | ICD-10-CM | POA: Diagnosis not present

## 2019-07-24 DIAGNOSIS — M25612 Stiffness of left shoulder, not elsewhere classified: Secondary | ICD-10-CM | POA: Diagnosis not present

## 2019-07-30 DIAGNOSIS — M25512 Pain in left shoulder: Secondary | ICD-10-CM | POA: Diagnosis not present

## 2019-07-30 DIAGNOSIS — M6281 Muscle weakness (generalized): Secondary | ICD-10-CM | POA: Diagnosis not present

## 2019-07-30 DIAGNOSIS — Z96612 Presence of left artificial shoulder joint: Secondary | ICD-10-CM | POA: Diagnosis not present

## 2019-07-30 DIAGNOSIS — M25612 Stiffness of left shoulder, not elsewhere classified: Secondary | ICD-10-CM | POA: Diagnosis not present

## 2019-08-08 DIAGNOSIS — I82409 Acute embolism and thrombosis of unspecified deep veins of unspecified lower extremity: Secondary | ICD-10-CM | POA: Diagnosis not present

## 2019-08-08 DIAGNOSIS — I2510 Atherosclerotic heart disease of native coronary artery without angina pectoris: Secondary | ICD-10-CM | POA: Diagnosis not present

## 2019-08-08 DIAGNOSIS — M25512 Pain in left shoulder: Secondary | ICD-10-CM | POA: Diagnosis not present

## 2019-08-10 DIAGNOSIS — Z79899 Other long term (current) drug therapy: Secondary | ICD-10-CM | POA: Diagnosis not present

## 2019-08-10 DIAGNOSIS — M25512 Pain in left shoulder: Secondary | ICD-10-CM | POA: Diagnosis not present

## 2019-08-10 DIAGNOSIS — M5416 Radiculopathy, lumbar region: Secondary | ICD-10-CM | POA: Diagnosis not present

## 2019-08-18 DIAGNOSIS — E785 Hyperlipidemia, unspecified: Secondary | ICD-10-CM | POA: Diagnosis not present

## 2019-08-18 DIAGNOSIS — I10 Essential (primary) hypertension: Secondary | ICD-10-CM | POA: Diagnosis not present

## 2019-08-18 DIAGNOSIS — I2510 Atherosclerotic heart disease of native coronary artery without angina pectoris: Secondary | ICD-10-CM | POA: Diagnosis not present

## 2019-08-18 DIAGNOSIS — D649 Anemia, unspecified: Secondary | ICD-10-CM | POA: Diagnosis not present

## 2019-08-18 DIAGNOSIS — I82409 Acute embolism and thrombosis of unspecified deep veins of unspecified lower extremity: Secondary | ICD-10-CM | POA: Diagnosis not present

## 2019-09-08 DIAGNOSIS — Z79899 Other long term (current) drug therapy: Secondary | ICD-10-CM | POA: Diagnosis not present

## 2019-09-08 DIAGNOSIS — M5416 Radiculopathy, lumbar region: Secondary | ICD-10-CM | POA: Diagnosis not present

## 2019-09-08 DIAGNOSIS — M25512 Pain in left shoulder: Secondary | ICD-10-CM | POA: Diagnosis not present

## 2019-09-10 DIAGNOSIS — S52122A Displaced fracture of head of left radius, initial encounter for closed fracture: Secondary | ICD-10-CM | POA: Diagnosis not present

## 2019-09-10 DIAGNOSIS — M25522 Pain in left elbow: Secondary | ICD-10-CM | POA: Diagnosis not present

## 2019-09-10 DIAGNOSIS — S42202A Unspecified fracture of upper end of left humerus, initial encounter for closed fracture: Secondary | ICD-10-CM | POA: Diagnosis not present

## 2019-09-10 DIAGNOSIS — Z96619 Presence of unspecified artificial shoulder joint: Secondary | ICD-10-CM | POA: Diagnosis not present

## 2019-09-15 DIAGNOSIS — I2510 Atherosclerotic heart disease of native coronary artery without angina pectoris: Secondary | ICD-10-CM | POA: Diagnosis not present

## 2019-09-15 DIAGNOSIS — D649 Anemia, unspecified: Secondary | ICD-10-CM | POA: Diagnosis not present

## 2019-09-15 DIAGNOSIS — I82409 Acute embolism and thrombosis of unspecified deep veins of unspecified lower extremity: Secondary | ICD-10-CM | POA: Diagnosis not present

## 2019-10-08 DIAGNOSIS — Z1159 Encounter for screening for other viral diseases: Secondary | ICD-10-CM | POA: Diagnosis not present

## 2019-10-08 DIAGNOSIS — R0602 Shortness of breath: Secondary | ICD-10-CM | POA: Diagnosis not present

## 2019-10-08 DIAGNOSIS — Z79899 Other long term (current) drug therapy: Secondary | ICD-10-CM | POA: Diagnosis not present

## 2019-10-08 DIAGNOSIS — M5416 Radiculopathy, lumbar region: Secondary | ICD-10-CM | POA: Diagnosis not present

## 2019-10-15 DIAGNOSIS — I82409 Acute embolism and thrombosis of unspecified deep veins of unspecified lower extremity: Secondary | ICD-10-CM | POA: Diagnosis not present

## 2019-10-15 DIAGNOSIS — I2510 Atherosclerotic heart disease of native coronary artery without angina pectoris: Secondary | ICD-10-CM | POA: Diagnosis not present

## 2019-10-15 DIAGNOSIS — D649 Anemia, unspecified: Secondary | ICD-10-CM | POA: Diagnosis not present

## 2019-10-27 DIAGNOSIS — Z9862 Peripheral vascular angioplasty status: Secondary | ICD-10-CM | POA: Diagnosis not present

## 2019-10-27 DIAGNOSIS — E785 Hyperlipidemia, unspecified: Secondary | ICD-10-CM | POA: Diagnosis not present

## 2019-10-27 DIAGNOSIS — I2510 Atherosclerotic heart disease of native coronary artery without angina pectoris: Secondary | ICD-10-CM | POA: Diagnosis not present

## 2019-10-27 DIAGNOSIS — M1990 Unspecified osteoarthritis, unspecified site: Secondary | ICD-10-CM | POA: Diagnosis not present

## 2019-10-27 DIAGNOSIS — Z86711 Personal history of pulmonary embolism: Secondary | ICD-10-CM | POA: Diagnosis not present

## 2019-10-27 DIAGNOSIS — R0602 Shortness of breath: Secondary | ICD-10-CM | POA: Diagnosis not present

## 2019-10-27 DIAGNOSIS — I10 Essential (primary) hypertension: Secondary | ICD-10-CM | POA: Diagnosis not present

## 2019-10-27 DIAGNOSIS — Z23 Encounter for immunization: Secondary | ICD-10-CM | POA: Diagnosis not present

## 2019-10-27 DIAGNOSIS — Z79899 Other long term (current) drug therapy: Secondary | ICD-10-CM | POA: Diagnosis not present

## 2019-10-27 DIAGNOSIS — R072 Precordial pain: Secondary | ICD-10-CM | POA: Diagnosis not present

## 2019-10-27 DIAGNOSIS — R079 Chest pain, unspecified: Secondary | ICD-10-CM | POA: Diagnosis not present

## 2019-10-27 DIAGNOSIS — G2581 Restless legs syndrome: Secondary | ICD-10-CM | POA: Diagnosis not present

## 2019-10-27 DIAGNOSIS — I249 Acute ischemic heart disease, unspecified: Secondary | ICD-10-CM | POA: Diagnosis not present

## 2019-10-27 DIAGNOSIS — I4891 Unspecified atrial fibrillation: Secondary | ICD-10-CM | POA: Diagnosis not present

## 2019-10-27 DIAGNOSIS — K219 Gastro-esophageal reflux disease without esophagitis: Secondary | ICD-10-CM | POA: Diagnosis not present

## 2019-10-27 DIAGNOSIS — I252 Old myocardial infarction: Secondary | ICD-10-CM | POA: Diagnosis not present

## 2019-10-27 DIAGNOSIS — Z79891 Long term (current) use of opiate analgesic: Secondary | ICD-10-CM | POA: Diagnosis not present

## 2019-10-27 DIAGNOSIS — Z7901 Long term (current) use of anticoagulants: Secondary | ICD-10-CM | POA: Diagnosis not present

## 2019-10-28 DIAGNOSIS — R0600 Dyspnea, unspecified: Secondary | ICD-10-CM | POA: Diagnosis not present

## 2019-10-28 DIAGNOSIS — I2510 Atherosclerotic heart disease of native coronary artery without angina pectoris: Secondary | ICD-10-CM | POA: Diagnosis not present

## 2019-10-28 DIAGNOSIS — R079 Chest pain, unspecified: Secondary | ICD-10-CM | POA: Diagnosis not present

## 2019-11-02 DIAGNOSIS — Z043 Encounter for examination and observation following other accident: Secondary | ICD-10-CM | POA: Diagnosis not present

## 2019-11-02 DIAGNOSIS — M545 Low back pain: Secondary | ICD-10-CM | POA: Diagnosis not present

## 2019-11-02 DIAGNOSIS — I10 Essential (primary) hypertension: Secondary | ICD-10-CM | POA: Diagnosis not present

## 2019-11-02 DIAGNOSIS — G894 Chronic pain syndrome: Secondary | ICD-10-CM | POA: Insufficient documentation

## 2019-11-02 DIAGNOSIS — M549 Dorsalgia, unspecified: Secondary | ICD-10-CM | POA: Diagnosis not present

## 2019-11-02 DIAGNOSIS — Z79899 Other long term (current) drug therapy: Secondary | ICD-10-CM | POA: Diagnosis not present

## 2019-11-02 DIAGNOSIS — J984 Other disorders of lung: Secondary | ICD-10-CM | POA: Diagnosis not present

## 2019-11-02 DIAGNOSIS — F1320 Sedative, hypnotic or anxiolytic dependence, uncomplicated: Secondary | ICD-10-CM | POA: Insufficient documentation

## 2019-11-02 DIAGNOSIS — F332 Major depressive disorder, recurrent severe without psychotic features: Secondary | ICD-10-CM | POA: Insufficient documentation

## 2019-11-02 DIAGNOSIS — M5489 Other dorsalgia: Secondary | ICD-10-CM | POA: Diagnosis not present

## 2019-11-02 DIAGNOSIS — F1120 Opioid dependence, uncomplicated: Secondary | ICD-10-CM | POA: Insufficient documentation

## 2019-11-02 DIAGNOSIS — Z743 Need for continuous supervision: Secondary | ICD-10-CM | POA: Diagnosis not present

## 2019-11-02 DIAGNOSIS — Z7901 Long term (current) use of anticoagulants: Secondary | ICD-10-CM | POA: Diagnosis not present

## 2019-11-02 DIAGNOSIS — Z881 Allergy status to other antibiotic agents status: Secondary | ICD-10-CM | POA: Diagnosis not present

## 2019-11-02 DIAGNOSIS — F411 Generalized anxiety disorder: Secondary | ICD-10-CM | POA: Insufficient documentation

## 2019-11-02 DIAGNOSIS — G8929 Other chronic pain: Secondary | ICD-10-CM | POA: Diagnosis not present

## 2019-11-02 DIAGNOSIS — R52 Pain, unspecified: Secondary | ICD-10-CM | POA: Diagnosis not present

## 2019-11-03 DIAGNOSIS — I10 Essential (primary) hypertension: Secondary | ICD-10-CM | POA: Diagnosis not present

## 2019-11-03 DIAGNOSIS — I480 Paroxysmal atrial fibrillation: Secondary | ICD-10-CM | POA: Insufficient documentation

## 2019-11-03 DIAGNOSIS — K5903 Drug induced constipation: Secondary | ICD-10-CM | POA: Diagnosis not present

## 2019-11-03 DIAGNOSIS — E785 Hyperlipidemia, unspecified: Secondary | ICD-10-CM | POA: Diagnosis not present

## 2019-11-03 DIAGNOSIS — G894 Chronic pain syndrome: Secondary | ICD-10-CM | POA: Diagnosis not present

## 2019-11-05 DIAGNOSIS — G894 Chronic pain syndrome: Secondary | ICD-10-CM | POA: Diagnosis not present

## 2019-11-05 DIAGNOSIS — I10 Essential (primary) hypertension: Secondary | ICD-10-CM | POA: Diagnosis not present

## 2019-11-05 DIAGNOSIS — Z043 Encounter for examination and observation following other accident: Secondary | ICD-10-CM | POA: Diagnosis not present

## 2019-11-05 DIAGNOSIS — K5903 Drug induced constipation: Secondary | ICD-10-CM | POA: Diagnosis not present

## 2019-11-05 DIAGNOSIS — S0990XA Unspecified injury of head, initial encounter: Secondary | ICD-10-CM | POA: Diagnosis not present

## 2019-11-05 DIAGNOSIS — E785 Hyperlipidemia, unspecified: Secondary | ICD-10-CM | POA: Diagnosis not present

## 2019-11-06 DIAGNOSIS — Z1159 Encounter for screening for other viral diseases: Secondary | ICD-10-CM | POA: Diagnosis not present

## 2019-11-06 DIAGNOSIS — M5416 Radiculopathy, lumbar region: Secondary | ICD-10-CM | POA: Diagnosis not present

## 2019-11-06 DIAGNOSIS — Z79891 Long term (current) use of opiate analgesic: Secondary | ICD-10-CM | POA: Diagnosis not present

## 2019-11-06 DIAGNOSIS — Z79899 Other long term (current) drug therapy: Secondary | ICD-10-CM | POA: Diagnosis not present

## 2019-11-06 DIAGNOSIS — R0602 Shortness of breath: Secondary | ICD-10-CM | POA: Diagnosis not present

## 2019-11-10 DIAGNOSIS — I82409 Acute embolism and thrombosis of unspecified deep veins of unspecified lower extremity: Secondary | ICD-10-CM | POA: Diagnosis not present

## 2019-11-10 DIAGNOSIS — D649 Anemia, unspecified: Secondary | ICD-10-CM | POA: Diagnosis not present

## 2019-11-10 DIAGNOSIS — I2510 Atherosclerotic heart disease of native coronary artery without angina pectoris: Secondary | ICD-10-CM | POA: Diagnosis not present

## 2019-12-04 DIAGNOSIS — Z1159 Encounter for screening for other viral diseases: Secondary | ICD-10-CM | POA: Diagnosis not present

## 2019-12-04 DIAGNOSIS — Z79891 Long term (current) use of opiate analgesic: Secondary | ICD-10-CM | POA: Diagnosis not present

## 2019-12-04 DIAGNOSIS — Z79899 Other long term (current) drug therapy: Secondary | ICD-10-CM | POA: Diagnosis not present

## 2019-12-04 DIAGNOSIS — M5416 Radiculopathy, lumbar region: Secondary | ICD-10-CM | POA: Diagnosis not present

## 2019-12-04 DIAGNOSIS — R0602 Shortness of breath: Secondary | ICD-10-CM | POA: Diagnosis not present

## 2019-12-07 ENCOUNTER — Other Ambulatory Visit: Payer: Self-pay

## 2019-12-07 NOTE — Patient Outreach (Signed)
[facility]) Care Management  12/07/2019  TOSHIBA NULL 03-Sep-1956 [id number]   Medication Adherence call to Mrs. Laura Hodges HIPPA Compliant Voice message left with a call back number. Mrs. Bear is showing past due on Atorvastatin 80 mg under [facility].   [place] Management Direct Dial [phone]  Fax [phone] [name]

## 2019-12-10 DIAGNOSIS — I82409 Acute embolism and thrombosis of unspecified deep veins of unspecified lower extremity: Secondary | ICD-10-CM | POA: Diagnosis not present

## 2019-12-10 DIAGNOSIS — K219 Gastro-esophageal reflux disease without esophagitis: Secondary | ICD-10-CM | POA: Diagnosis not present

## 2019-12-10 DIAGNOSIS — E785 Hyperlipidemia, unspecified: Secondary | ICD-10-CM | POA: Diagnosis not present

## 2019-12-10 DIAGNOSIS — I2510 Atherosclerotic heart disease of native coronary artery without angina pectoris: Secondary | ICD-10-CM | POA: Diagnosis not present

## 2019-12-10 DIAGNOSIS — M25511 Pain in right shoulder: Secondary | ICD-10-CM | POA: Diagnosis not present

## 2019-12-10 DIAGNOSIS — I10 Essential (primary) hypertension: Secondary | ICD-10-CM | POA: Diagnosis not present

## 2019-12-17 DIAGNOSIS — I82409 Acute embolism and thrombosis of unspecified deep veins of unspecified lower extremity: Secondary | ICD-10-CM | POA: Diagnosis not present

## 2019-12-17 DIAGNOSIS — I2510 Atherosclerotic heart disease of native coronary artery without angina pectoris: Secondary | ICD-10-CM | POA: Diagnosis not present

## 2019-12-17 DIAGNOSIS — K219 Gastro-esophageal reflux disease without esophagitis: Secondary | ICD-10-CM | POA: Diagnosis not present

## 2019-12-17 DIAGNOSIS — M25511 Pain in right shoulder: Secondary | ICD-10-CM | POA: Diagnosis not present

## 2021-04-17 DIAGNOSIS — Z95828 Presence of other vascular implants and grafts: Secondary | ICD-10-CM | POA: Insufficient documentation

## 2021-08-15 DIAGNOSIS — M5412 Radiculopathy, cervical region: Secondary | ICD-10-CM | POA: Insufficient documentation

## 2022-11-08 DIAGNOSIS — J449 Chronic obstructive pulmonary disease, unspecified: Secondary | ICD-10-CM | POA: Insufficient documentation
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
## 2024-06-27 ENCOUNTER — Inpatient Hospital Stay (HOSPITAL_COMMUNITY)
Admission: EM | Admit: 2024-06-27 | Discharge: 2024-06-30 | DRG: 035 | Disposition: A | Discharge status: Home / Self Care | Source: Other Acute Inpatient Hospital | Attending: Internal Medicine | Admitting: Internal Medicine

## 2024-06-27 ENCOUNTER — Encounter: Payer: Self-pay | Admitting: Internal Medicine

## 2024-06-27 ENCOUNTER — Encounter (HOSPITAL_COMMUNITY): Payer: Self-pay | Admitting: Internal Medicine

## 2024-06-27 ENCOUNTER — Other Ambulatory Visit: Payer: Self-pay | Admitting: Internal Medicine

## 2024-06-27 ENCOUNTER — Encounter (HOSPITAL_COMMUNITY): Payer: Self-pay

## 2024-06-27 ENCOUNTER — Other Ambulatory Visit: Payer: Self-pay

## 2024-06-27 ENCOUNTER — Inpatient Hospital Stay (HOSPITAL_COMMUNITY)

## 2024-06-27 DIAGNOSIS — D6851 Activated protein C resistance: Secondary | ICD-10-CM | POA: Diagnosis present

## 2024-06-27 DIAGNOSIS — J449 Chronic obstructive pulmonary disease, unspecified: Secondary | ICD-10-CM | POA: Diagnosis present

## 2024-06-27 DIAGNOSIS — Z604 Social exclusion and rejection: Secondary | ICD-10-CM | POA: Diagnosis present

## 2024-06-27 DIAGNOSIS — Z7902 Long term (current) use of antithrombotics/antiplatelets: Secondary | ICD-10-CM

## 2024-06-27 DIAGNOSIS — Z791 Long term (current) use of non-steroidal anti-inflammatories (NSAID): Secondary | ICD-10-CM

## 2024-06-27 DIAGNOSIS — E785 Hyperlipidemia, unspecified: Secondary | ICD-10-CM | POA: Diagnosis present

## 2024-06-27 DIAGNOSIS — R29700 NIHSS score 0: Secondary | ICD-10-CM | POA: Diagnosis present

## 2024-06-27 DIAGNOSIS — I6522 Occlusion and stenosis of left carotid artery: Secondary | ICD-10-CM

## 2024-06-27 DIAGNOSIS — I63512 Cerebral infarction due to unspecified occlusion or stenosis of left middle cerebral artery: Secondary | ICD-10-CM | POA: Diagnosis not present

## 2024-06-27 DIAGNOSIS — Z79890 Hormone replacement therapy (postmenopausal): Secondary | ICD-10-CM

## 2024-06-27 DIAGNOSIS — Z86718 Personal history of other venous thrombosis and embolism: Secondary | ICD-10-CM

## 2024-06-27 DIAGNOSIS — K219 Gastro-esophageal reflux disease without esophagitis: Secondary | ICD-10-CM | POA: Diagnosis present

## 2024-06-27 DIAGNOSIS — I63522 Cerebral infarction due to unspecified occlusion or stenosis of left anterior cerebral artery: Secondary | ICD-10-CM | POA: Diagnosis not present

## 2024-06-27 DIAGNOSIS — E669 Obesity, unspecified: Secondary | ICD-10-CM | POA: Diagnosis present

## 2024-06-27 DIAGNOSIS — F411 Generalized anxiety disorder: Secondary | ICD-10-CM | POA: Diagnosis present

## 2024-06-27 DIAGNOSIS — Z95828 Presence of other vascular implants and grafts: Secondary | ICD-10-CM

## 2024-06-27 DIAGNOSIS — G2581 Restless legs syndrome: Secondary | ICD-10-CM | POA: Diagnosis present

## 2024-06-27 DIAGNOSIS — I482 Chronic atrial fibrillation, unspecified: Secondary | ICD-10-CM | POA: Diagnosis present

## 2024-06-27 DIAGNOSIS — G459 Transient cerebral ischemic attack, unspecified: Secondary | ICD-10-CM | POA: Diagnosis not present

## 2024-06-27 DIAGNOSIS — Z7984 Long term (current) use of oral hypoglycemic drugs: Secondary | ICD-10-CM | POA: Diagnosis not present

## 2024-06-27 DIAGNOSIS — Z79891 Long term (current) use of opiate analgesic: Secondary | ICD-10-CM

## 2024-06-27 DIAGNOSIS — R29810 Facial weakness: Secondary | ICD-10-CM | POA: Diagnosis present

## 2024-06-27 DIAGNOSIS — F332 Major depressive disorder, recurrent severe without psychotic features: Secondary | ICD-10-CM | POA: Diagnosis present

## 2024-06-27 DIAGNOSIS — F32A Depression, unspecified: Secondary | ICD-10-CM | POA: Diagnosis present

## 2024-06-27 DIAGNOSIS — F1120 Opioid dependence, uncomplicated: Secondary | ICD-10-CM | POA: Diagnosis present

## 2024-06-27 DIAGNOSIS — Z79899 Other long term (current) drug therapy: Secondary | ICD-10-CM | POA: Diagnosis not present

## 2024-06-27 DIAGNOSIS — R471 Dysarthria and anarthria: Secondary | ICD-10-CM | POA: Diagnosis present

## 2024-06-27 DIAGNOSIS — I480 Paroxysmal atrial fibrillation: Secondary | ICD-10-CM | POA: Diagnosis present

## 2024-06-27 DIAGNOSIS — I4891 Unspecified atrial fibrillation: Secondary | ICD-10-CM | POA: Diagnosis not present

## 2024-06-27 DIAGNOSIS — Z885 Allergy status to narcotic agent status: Secondary | ICD-10-CM

## 2024-06-27 DIAGNOSIS — Z7901 Long term (current) use of anticoagulants: Secondary | ICD-10-CM | POA: Diagnosis not present

## 2024-06-27 DIAGNOSIS — Z7982 Long term (current) use of aspirin: Secondary | ICD-10-CM

## 2024-06-27 DIAGNOSIS — Z6833 Body mass index (BMI) 33.0-33.9, adult: Secondary | ICD-10-CM

## 2024-06-27 DIAGNOSIS — I10 Essential (primary) hypertension: Secondary | ICD-10-CM | POA: Diagnosis present

## 2024-06-27 DIAGNOSIS — R0902 Hypoxemia: Secondary | ICD-10-CM | POA: Diagnosis not present

## 2024-06-27 DIAGNOSIS — I2510 Atherosclerotic heart disease of native coronary artery without angina pectoris: Secondary | ICD-10-CM | POA: Diagnosis present

## 2024-06-27 DIAGNOSIS — Z96652 Presence of left artificial knee joint: Secondary | ICD-10-CM | POA: Diagnosis present

## 2024-06-27 DIAGNOSIS — Z7401 Bed confinement status: Secondary | ICD-10-CM | POA: Diagnosis not present

## 2024-06-27 DIAGNOSIS — Z881 Allergy status to other antibiotic agents status: Secondary | ICD-10-CM

## 2024-06-27 DIAGNOSIS — F17210 Nicotine dependence, cigarettes, uncomplicated: Secondary | ICD-10-CM | POA: Diagnosis present

## 2024-06-27 DIAGNOSIS — I63412 Cerebral infarction due to embolism of left middle cerebral artery: Principal | ICD-10-CM | POA: Diagnosis present

## 2024-06-27 DIAGNOSIS — G894 Chronic pain syndrome: Secondary | ICD-10-CM | POA: Diagnosis present

## 2024-06-27 DIAGNOSIS — Z955 Presence of coronary angioplasty implant and graft: Secondary | ICD-10-CM

## 2024-06-27 DIAGNOSIS — Z86711 Personal history of pulmonary embolism: Secondary | ICD-10-CM | POA: Diagnosis not present

## 2024-06-27 DIAGNOSIS — I6389 Other cerebral infarction: Secondary | ICD-10-CM | POA: Diagnosis not present

## 2024-06-27 DIAGNOSIS — Z716 Tobacco abuse counseling: Secondary | ICD-10-CM

## 2024-06-27 HISTORY — DX: Prediabetes: R73.03

## 2024-06-27 LAB — COMPREHENSIVE METABOLIC PANEL WITH GFR
ALT: 18 U/L (ref 0–44)
AST: 22 U/L (ref 15–41)
Albumin: 3.7 g/dL (ref 3.5–5.0)
Alkaline Phosphatase: 69 U/L (ref 38–126)
Anion gap: 9 (ref 5–15)
BUN: 12 mg/dL (ref 8–23)
CO2: 26 mmol/L (ref 22–32)
Calcium: 9.2 mg/dL (ref 8.9–10.3)
Chloride: 104 mmol/L (ref 98–111)
Creatinine, Ser: 0.75 mg/dL (ref 0.44–1.00)
GFR, Estimated: >60 mL/min (ref >60)
Glucose, Bld: 103 mg/dL — ABNORMAL HIGH (ref 70–99)
Potassium: 4 mmol/L (ref 3.5–5.1)
Sodium: 139 mmol/L (ref 135–145)
Total Bilirubin: 0.4 mg/dL (ref 0.0–1.2)
Total Protein: 6.6 g/dL (ref 6.5–8.1)

## 2024-06-27 LAB — CBC
HCT: 41.2 % (ref 36.0–46.0)
Hemoglobin: 14 g/dL (ref 12.0–15.0)
MCH: 32 pg (ref 26.0–34.0)
MCHC: 34 g/dL (ref 30.0–36.0)
MCV: 94.3 fL (ref 80.0–100.0)
Platelets: 201 K/uL (ref 150–400)
RBC: 4.37 MIL/uL (ref 3.87–5.11)
RDW: 12.8 % (ref 11.5–15.5)
WBC: 5.6 K/uL (ref 4.0–10.5)
nRBC: 0 % (ref 0.0–0.2)

## 2024-06-27 LAB — HIV ANTIBODY (ROUTINE TESTING W REFLEX): HIV Screen 4th Generation wRfx: NONREACTIVE

## 2024-06-27 LAB — HEMOGLOBIN A1C
Hgb A1c MFr Bld: 5 % (ref 4.8–5.6)
Mean Plasma Glucose: 96.8 mg/dL

## 2024-06-27 LAB — HEPARIN LEVEL (UNFRACTIONATED): Heparin Unfractionated: >1.1 [IU]/mL — ABNORMAL HIGH (ref 0.30–0.70)

## 2024-06-27 LAB — APTT: aPTT: 40 s — ABNORMAL HIGH (ref 24–36)

## 2024-06-27 MED ORDER — ACETAMINOPHEN 325 MG PO TABS
650.0000 mg | ORAL_TABLET | Freq: Four times a day (QID) | ORAL | Status: DC | PRN
  Administered 2024-06-27 – 2024-06-30 (×4): 650 mg via ORAL
  Filled 2024-06-27 (×5): qty 2

## 2024-06-27 MED ORDER — LINACLOTIDE 145 MCG PO CAPS
145.0000 ug | ORAL_CAPSULE | Freq: Every morning | ORAL | Status: DC
  Administered 2024-06-28 – 2024-06-30 (×2): 145 ug via ORAL
  Filled 2024-06-27 (×3): qty 1

## 2024-06-27 MED ORDER — ORAL CARE MOUTH RINSE: 15.0000 mL | OROMUCOSAL | Status: DC | PRN

## 2024-06-27 MED ORDER — LEVOTHYROXINE SODIUM 88 MCG PO TABS
88.0000 ug | ORAL_TABLET | Freq: Every day | ORAL | Status: DC
  Administered 2024-06-28 – 2024-06-30 (×3): 88 ug via ORAL
  Filled 2024-06-27 (×3): qty 1

## 2024-06-27 MED ORDER — DIAZEPAM 5 MG PO TABS: 10.0000 mg | ORAL_TABLET | Freq: Two times a day (BID) | ORAL | Status: DC | PRN

## 2024-06-27 MED ORDER — CLOPIDOGREL BISULFATE 75 MG PO TABS
75.0000 mg | ORAL_TABLET | Freq: Every day | ORAL | Status: DC
  Administered 2024-06-27 – 2024-06-30 (×4): 75 mg via ORAL
  Filled 2024-06-27 (×4): qty 1

## 2024-06-27 MED ORDER — ROPINIROLE HCL ER 4 MG PO TB24
4.0000 mg | ORAL_TABLET | Freq: Every day | ORAL | Status: DC
  Filled 2024-06-27: qty 1

## 2024-06-27 MED ORDER — NICOTINE 21 MG/24HR TD PT24
21.0000 mg | MEDICATED_PATCH | Freq: Every day | TRANSDERMAL | Status: DC
  Administered 2024-06-27 – 2024-06-30 (×4): 21 mg via TRANSDERMAL
  Filled 2024-06-27 (×4): qty 1

## 2024-06-27 MED ORDER — GABAPENTIN 400 MG PO CAPS: 800.0000 mg | ORAL_CAPSULE | Freq: Two times a day (BID) | ORAL | Status: DC | PRN

## 2024-06-27 MED ORDER — ATORVASTATIN CALCIUM 80 MG PO TABS: 80.0000 mg | ORAL_TABLET | Freq: Every evening | ORAL | Status: DC

## 2024-06-27 MED ORDER — OXYCODONE HCL 5 MG PO TABS
10.0000 mg | ORAL_TABLET | Freq: Four times a day (QID) | ORAL | Status: DC | PRN
  Administered 2024-06-27 – 2024-06-30 (×10): 10 mg via ORAL
  Filled 2024-06-27 (×10): qty 2

## 2024-06-27 MED ORDER — HEPARIN (PORCINE) 25000 UT/250ML-% IV SOLN
1250.0000 [IU]/h | INTRAVENOUS | Status: DC
  Administered 2024-06-28: 1000 [IU]/h via INTRAVENOUS
  Administered 2024-06-29: 1150 [IU]/h via INTRAVENOUS
  Filled 2024-06-27 (×2): qty 250

## 2024-06-27 MED ORDER — PREGABALIN 75 MG PO CAPS
225.0000 mg | ORAL_CAPSULE | Freq: Three times a day (TID) | ORAL | Status: DC
  Administered 2024-06-27 – 2024-06-30 (×8): 225 mg via ORAL
  Filled 2024-06-27 (×7): qty 3
  Filled 2024-06-27: qty 9

## 2024-06-27 MED ORDER — SODIUM CHLORIDE 0.9% FLUSH
3.0000 mL | Freq: Two times a day (BID) | INTRAVENOUS | Status: DC
  Administered 2024-06-27 – 2024-06-30 (×6): 3 mL via INTRAVENOUS

## 2024-06-27 MED ORDER — ASPIRIN 81 MG PO CHEW
81.0000 mg | CHEWABLE_TABLET | Freq: Every day | ORAL | Status: DC
  Administered 2024-06-27 – 2024-06-30 (×4): 81 mg via ORAL
  Filled 2024-06-27 (×4): qty 1

## 2024-06-27 MED ORDER — ACETAMINOPHEN 650 MG RE SUPP: 650.0000 mg | Freq: Four times a day (QID) | RECTAL | Status: DC | PRN

## 2024-06-27 MED ORDER — ROPINIROLE HCL 1 MG PO TABS
1.0000 mg | ORAL_TABLET | Freq: Three times a day (TID) | ORAL | Status: DC
  Administered 2024-06-27 – 2024-06-30 (×9): 1 mg via ORAL
  Filled 2024-06-27 (×9): qty 1

## 2024-06-27 MED ORDER — PREGABALIN 25 MG PO CAPS: 50.0000 mg | ORAL_CAPSULE | Freq: Every day | ORAL | Status: DC

## 2024-06-27 MED ORDER — STROKE: EARLY STAGES OF RECOVERY BOOK
Freq: Once | Status: AC
  Filled 2024-06-27: qty 1

## 2024-06-27 MED ORDER — CITALOPRAM HYDROBROMIDE 20 MG PO TABS: 40.0000 mg | ORAL_TABLET | Freq: Every day | ORAL | Status: DC

## 2024-06-27 MED ORDER — POLYETHYLENE GLYCOL 3350 17 G PO PACK
17.0000 g | PACK | Freq: Every day | ORAL | Status: DC | PRN
  Administered 2024-06-28 – 2024-06-30 (×2): 17 g via ORAL
  Filled 2024-06-27 (×2): qty 1

## 2024-06-27 MED ORDER — ROSUVASTATIN CALCIUM 20 MG PO TABS
40.0000 mg | ORAL_TABLET | Freq: Every day | ORAL | Status: DC
  Administered 2024-06-28 – 2024-06-30 (×3): 40 mg via ORAL
  Filled 2024-06-27 (×3): qty 2

## 2024-06-27 MED ORDER — CHLORHEXIDINE GLUCONATE CLOTH 2 % EX PADS
6.0000 | MEDICATED_PAD | Freq: Every day | CUTANEOUS | Status: DC
  Administered 2024-06-29 – 2024-06-30 (×2): 6 via TOPICAL

## 2024-06-27 MED ORDER — PANTOPRAZOLE SODIUM 40 MG PO TBEC
40.0000 mg | DELAYED_RELEASE_TABLET | Freq: Every day | ORAL | Status: DC
  Administered 2024-06-28 – 2024-06-30 (×2): 40 mg via ORAL
  Filled 2024-06-27 (×2): qty 1

## 2024-06-27 NOTE — Progress Notes (Signed)
 Plan of Care Note for accepted transfer   Patient: Laura Hodges MRN: [id number]   DOA: (Not on file)  Facility requesting transfer: [facility] Requesting Provider: Dr. Fritz Reason for transfer: Services not available Facility course:   Patient presented with right-sided facial droop and numbness and tingling on the right arm with some slurred speech of several hours duration yesterday.  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  But otherwise normal.  MRI brain showed no acute abnormality but did show the above critical stenosis with recommendation for vascular surgery input.  Vascular surgery was consulted today after MRI was done today and the radiologist notified the hospitalist there of possible unstable clot/string sign.  After surgery agreed that patient would be appropriate for transfer and would need their evaluation.  Patient does have factor V Leiden and history of DVT/PE as well as IVC filter.  Had been on anticoagulation in the past but off it for couple years.  After initial workup teleneuro did recommend dose of Xarelto  given her history of factor V Leiden and atrial fibrillation prior to MRI result.  She only received 1 dose this has been held.  Provider there may start patient on heparin  infusion prior to transfer.  Vascular surgery will need to be notified on patient arrival to evaluate in person.  Plan of care: The patient is accepted for admission to Telemetry unit, at [facility]..   Author: Marsa KATHEE Scurry, MD 06/27/2024  Check [email or web address] for on-call coverage.  Nursing staff, Please call [facility] Admits & Consults System-Wide number on Amion as soon as patient's arrival, so appropriate admitting provider can evaluate the pt.

## 2024-06-27 NOTE — Progress Notes (Signed)
 [facility] paged, made aware of pt arrival to unit. MD will come to bedside to assess pt.

## 2024-06-27 NOTE — Progress Notes (Signed)
 Pharmacy Consult for Heparin   Indication: Critical carotid artery stenosis w/o infarct   Allergies  Allergen Reactions   Doxycycline Other (See Comments)    unknown   Morphine And Codeine Hives    Patient Measurements:  Heparin  DW : 70 kg     Vital Signs: Temp: 97.7 F (36.5 C) (07/05 1523) Temp Source: Oral (07/05 1523) BP: 120/83 (07/05 1523) Pulse Rate: 69 (07/05 1523)  Labs: No results for input(s): HGB, HCT, PLT, APTT, LABPROT, INR, HEPARINUNFRC, HEPRLOWMOCWT, CREATININE, CKTOTAL, CKMB, TROPONINI in the last 72 hours.  CrCl cannot be calculated (Patient's most recent lab result is older than the maximum 21 days allowed.).  Assessment: Laura Hodges a 68 y.o. female presented with critical carotid artery stenosis. Pt with PMH s/f Factor V Leiden, AF and h/o DVT/PE with IVC filter in place. Previously on [facility], but not for a couple of years. On admission it was recommended by Teleneuro to start Xarelto - she received 1 dose of 20 mg at [facility]- 7/5 0946 (confirmed with pt). Pharmacy has been consulted for heparin  dosing.   Goal of Therapy:  HL 0.3-0.7 aPTT 66-102  Monitor platelets by anticoagulation protocol: Yes   Plan:  Start heparin  1,000 units/hour  (no bolus)- 7/6 1000 aPTT/anti-Xa level now  CBC/anti-Xa/aPTT daily until anti-Xa and aPTT are correlating    Massie Fila, PharmD Clinical Pharmacist  06/27/2024 3:58 PM

## 2024-06-27 NOTE — Consult Note (Signed)
 NEUROLOGY CONSULT NOTE   Date of service: June 27, 2024 Patient Name: Laura Hodges MRN:  [id number] DOB:  March 17, 1956 Chief Complaint: TIA Requesting Provider: Seena Marsa NOVAK, MD  History of Present Illness  Laura Hodges is a 68 y.o. female with hx of Htn, HLD, Afibb not on AC, CAD, GERD, DVT/PE, COPD who presented to [facility] with R facial numbness and slurred speech. Workup notable for L ICA string sign/critical stenosis and was trnasferred to [facility] for Vascular surgery evaluation.   She was started on Xarelto  at [facility] and transitioned to Hep gtt here.  MRI Brain at [facility] with no acute stroke. CTH was negative for a large hypodensity concerning for a large territory infarct or hyperdensity concerning for an ICH. 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  Patient reports that on July 4, around 8:50 PM, she was walking her house when she had sudden onset right facial droop, numbness and this really scared her.  She is visiting from outside the city and sought care at [facility].  She endorses smoking about a pack a day.  She endorses significant anxiety and stress of multiple family issues.  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   LKW: 8:50 PM on 06/26/2024 Modified rankin score: 0-Completely asymptomatic and back to baseline post- stroke IV Thrombolysis: Not offered, NIH stroke scale of 0 and no symptoms.   EVT: Not offered, NIH stroke scale 0 in the symptoms.    NIHSS components Score: Comment  1a Level of Conscious 0[]  1[]  2[]  3[]      1b LOC Questions 0[]  1[]  2[]       1c LOC Commands 0[]  1[]  2[]       2 Best Gaze 0[]  1[]  2[]       3 Visual 0[]  1[]  2[]  3[]      4 Facial Palsy 0[]  1[]  2[]  3[]      5a Motor Arm - left 0[]  1[]  2[]  3[]  4[]  UN[]    5b Motor Arm - Right 0[]  1[]  2[]  3[]  4[]  UN[]    6a Motor Leg - Left 0[]  1[]  2[]  3[]  4[]  UN[]    6b Motor Leg - Right 0[]  1[]  2[]  3[]  4[]  UN[]     7 Limb Ataxia 0[]  1[]  2[]  UN[]      8 Sensory 0[]  1[]  2[]  UN[]      9 Best Language 0[]  1[]  2[]  3[]      10 Dysarthria 0[]  1[]  2[]  UN[]      11 Extinct. and Inattention 0[]  1[]  2[]       TOTAL: 0      ROS  Comprehensive ROS performed and pertinent positives documented in HPI   Past History   Past Medical History:  Diagnosis Date   Anxiety    Arthritis    Coronary artery disease    pt states has 1 stent placed / 4 years ago    DVT (deep venous thrombosis) (HCC)    Dyslipidemia    GERD (gastroesophageal reflux disease)    Presence of IVC filter    Pulmonary embolism (HCC)    Sciatica    Wears glasses     Past Surgical History:  Procedure Laterality Date   ABDOMINAL HYSTERECTOMY     ABDOMINAL SURGERY     ANGIOPLASTY     BACK SURGERY     BREAST SURGERY     GOITER SURGERY     KNEE SURGERY     TOTAL KNEE ARTHROPLASTY Left 07/17/2016  Procedure: TOTAL KNEE ARTHROPLASTY;  Surgeon: Donnice Car, MD;  Location: [facility];  Service: Orthopedics;  Laterality: Left;    Family History: History reviewed. No pertinent family history.  Social History  reports that she has been smoking cigarettes. She has a 10 pack-year smoking history. She has never used smokeless tobacco. She reports that she does not drink alcohol and does not use drugs.  Allergies  Allergen Reactions   Morphine And Codeine Hives   Doxycycline Other (See Comments)    unknown    Medications   Current Facility-Administered Medications:    [START ON 06/28/2024]  stroke: early stages of recovery book, , Does not apply, Once, Seena Marsa NOVAK, MD   acetaminophen  (TYLENOL ) tablet 650 mg, 650 mg, Oral, Q6H PRN, 650 mg at 06/27/24 2157 **OR** acetaminophen  (TYLENOL ) suppository 650 mg, 650 mg, Rectal, Q6H PRN, Melvin, Alexander B, MD   aspirin  chewable tablet 81 mg, 81 mg, Oral, Daily, Gretta Lonni PARAS, MD, 81 mg at 06/27/24 1823   [START ON 06/28/2024] Chlorhexidine  Gluconate Cloth 2 % PADS 6 each, 6 each, Topical,  Q0600, Melvin, Alexander B, MD   clopidogrel  (PLAVIX ) tablet 75 mg, 75 mg, Oral, Daily, Gretta Lonni PARAS, MD, 75 mg at 06/27/24 1823   [START ON 06/28/2024] heparin  ADULT infusion 100 units/mL (25000 units/250mL), 1,000 Units/hr, Intravenous, Continuous, Paytes, Austin A, RPH   [START ON 06/28/2024] levothyroxine  (SYNTHROID ) tablet 88 mcg, 88 mcg, Oral, QAC breakfast, Melvin, Alexander B, MD   [START ON 06/28/2024] linaclotide  (LINZESS ) capsule 145 mcg, 145 mcg, Oral, q morning, Melvin, Alexander B, MD   nicotine  (NICODERM CQ  - dosed in mg/24 hours) patch 21 mg, 21 mg, Transdermal, Daily, Melvin, Alexander B, MD, 21 mg at 06/27/24 1736   Oral care mouth rinse, 15 mL, Mouth Rinse, PRN, Melvin, Alexander B, MD   oxyCODONE  (Oxy IR/ROXICODONE ) immediate release tablet 10 mg, 10 mg, Oral, Q6H PRN, Melvin, Alexander B, MD, 10 mg at 06/27/24 2156   [START ON 06/28/2024] pantoprazole  (PROTONIX ) EC tablet 40 mg, 40 mg, Oral, Daily, Melvin, Alexander B, MD   polyethylene glycol (MIRALAX  / GLYCOLAX ) packet 17 g, 17 g, Oral, Daily PRN, Melvin, Alexander B, MD   pregabalin  (LYRICA ) capsule 225 mg, 225 mg, Oral, TID, Melvin, Alexander B, MD, 225 mg at 06/27/24 2157   rOPINIRole  (REQUIP ) tablet 1 mg, 1 mg, Oral, TID, Melvin, Alexander B, MD, 1 mg at 06/27/24 2158   [START ON 06/28/2024] rosuvastatin  (CRESTOR ) tablet 40 mg, 40 mg, Oral, Daily, Melvin, Alexander B, MD   sodium chloride  flush (NS) 0.9 % injection 3 mL, 3 mL, Intravenous, Q12H, Melvin, Alexander B, MD, 3 mL at 06/27/24 2202  Vitals   Vitals:   06/27/24 1523 06/27/24 1931  BP: 120/83 (!) 96/52  Pulse: 69 69  Resp: 18 16  Temp: 97.7 F (36.5 C) 98 F (36.7 C)  TempSrc: Oral Oral  SpO2: 90% 91%    There is no height or weight on file to calculate BMI.   Physical Exam   General: Laying comfortably in bed; in no acute distress.  HENT: Normal oropharynx and mucosa. Normal external appearance of ears and nose.  Neck: Supple, no pain or tenderness   CV: No JVD. No peripheral edema.  Pulmonary: Symmetric Chest rise. Normal respiratory effort.  Abdomen: Soft to touch, non-tender.  Ext: No cyanosis, edema, or deformity  Skin: No rash. Normal palpation of skin.   Musculoskeletal: Normal digits and nails by inspection. No clubbing.   Neurologic Examination  Mental status/Cognition: Alert, oriented to self, place, month and year, good attention.  Speech/language: Fluent, comprehension intact, object naming intact, repetition intact.  Cranial nerves:   CN II Pupils equal and reactive to light, no VF deficits    CN III,IV,VI EOM intact, no gaze preference or deviation, no nystagmus    CN V normal sensation in V1, V2, and V3 segments bilaterally    CN VII no asymmetry, no nasolabial fold flattening    CN VIII normal hearing to speech    CN IX & X normal palatal elevation, no uvular deviation   CN XI 5/5 head turn and 5/5 shoulder shrug bilaterally    CN XII midline tongue protrusion    Motor:  Muscle bulk: Normal, tone normal, pronator drift none tremor none Mvmt Root Nerve  Muscle Right Left Comments  SA C5/6 Ax Deltoid 5 5   EF C5/6 Mc Biceps 5 5   EE C6/7/8 Rad Triceps 5 5   WF C6/7 Med FCR     WE C7/8 PIN ECU     F Ab C8/T1 U ADM/FDI 5 5   HF L1/2/3 Fem Illopsoas 5 5   KE L2/3/4 Fem Quad 5 5   DF L4/5 D Peron Tib Ant 5 5   PF S1/2 Tibial Grc/Sol 5 5    Sensation:  Light touch Intact throughout   Pin prick    Temperature    Vibration   Proprioception    Coordination/Complex Motor:  - Finger to Nose intact bilaterally - Heel to shin intact bilaterally - Rapid alternating movement are normal - Gait: Deferred for patient safety. Labs/Imaging/Neurodiagnostic studies   CBC:  Recent Labs  Lab 2024/07/03 1819  WBC 5.6  HGB 14.0  HCT 41.2  MCV 94.3  PLT 201   Basic Metabolic Panel:  Lab Results  Component Value Date   NA 139 03-Jul-2024   K 4.0 2024/07/03   CO2 26 2024/07/03   GLUCOSE 103 (H) 2024-07-03   BUN 12  07/03/2024   CREATININE 0.75 July 03, 2024   CALCIUM  9.2 07/03/24   GFRNONAA >60 03-Jul-2024   GFRAA >60 12/03/2016   Lipid Panel:  Lab Results  Component Value Date   LDLCALC 78 09/06/2008   HgbA1c:  Lab Results  Component Value Date   HGBA1C 5.0 03-Jul-2024   Urine Drug Screen: No results found for: LABOPIA, COCAINSCRNUR, LABBENZ, AMPHETMU, THCU, LABBARB  Alcohol Level No results found for: [facility] INR  Lab Results  Component Value Date   INR 1.54 07/19/2016   APTT  Lab Results  Component Value Date   APTT 40 (H) Jul 03, 2024   AED levels: No results found for: PHENYTOIN, ZONISAMIDE, LAMOTRIGINE, LEVETIRACETA  CT Head without contrast(Personally reviewed): CTH was negative for a large hypodensity concerning for a large territory infarct or hyperdensity concerning for an ICH  CT angio Head and Neck with contrast(Personally reviewed): Short-segment, chronic appearing 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  ASSESSMENT   Laura Hodges is a 69 y.o. female who presents with episode of right facial numbness and weakness with spontaneous resolution.  Episode most concerning for TIA.  She was noted to have short segment near complete occlusion of the proximal left ICA.  I suspect that the likely etiology of her TIA is symptomatic carotid stenosis.  Agree with intervention per vascular surgery.  Antiplatelet treatment per vascular surgery at this time.  RECOMMENDATIONS  - Frequent Neuro checks  per stroke unit protocol - Recommend obtaining TTE  - Recommend obtaining Lipid panel with LDL - Please start statin if LDL > 70 - Recommend HbA1c to evaluate for diabetes and how well it is controlled. - Antithrombotic -per vascular surgery at this time. - Recommend DVT ppx - SBP goal -treat blood pressure only if the systolic is above 180. - Recommend Telemetry monitoring  for arrythmia - Recommend bedside swallow screen prior to PO intake. - Stroke education booklet - Recommend PT/OT/SLP consult - Counseled her on the importance of quitting smoking to reduce risk of stroke in the future.  ______________________________________________________________________    Signed, Shanteria Laye, MD [facility]

## 2024-06-27 NOTE — Consult Note (Signed)
 Hospital Consult    Reason for Consult:  Symptomatic left ICA stenosis Referring Physician:  [facility] MRN #:  [id number]  History of Present Illness: This is a 68 y.o. female with history of coronary artery disease, hyperlipidemia, atrial fibrillation, coronary artery disease, factor V Leiden with history of DVT PE that presents as a transfer from [facility] for symptomatic left internal carotid stenosis.  She presented there yesterday with slurred speech and some right facial numbness.  Workup included MRI showed no acute infarct but a critical stenosis of the left carotid artery.  CTA head and neck showed chronic appearing near complete occlusion of the proximal left ICA.  She states her symptoms have resolved.  No prior head or neck surgery.  Past Medical History:  Diagnosis Date   Anxiety    Arthritis    Coronary artery disease    pt states has 1 stent placed / 4 years ago    DVT (deep venous thrombosis) (HCC)    Dyslipidemia    GERD (gastroesophageal reflux disease)    Presence of IVC filter    Pulmonary embolism (HCC)    Sciatica    Wears glasses     Past Surgical History:  Procedure Laterality Date   ABDOMINAL HYSTERECTOMY     ABDOMINAL SURGERY     ANGIOPLASTY     BACK SURGERY     BREAST SURGERY     GOITER SURGERY     KNEE SURGERY     TOTAL KNEE ARTHROPLASTY Left 07/17/2016   Procedure: TOTAL KNEE ARTHROPLASTY;  Surgeon: Donnice Car, MD;  Location: [facility];  Service: Orthopedics;  Laterality: Left;    Allergies  Allergen Reactions   Morphine And Codeine Hives   Doxycycline Other (See Comments)    unknown    Prior to Admission medications   Medication Sig Start Date End Date Taking? Authorizing Provider  acetaminophen  (TYLENOL ) 325 MG tablet Take 2 tablets (650 mg total) by mouth every 6 (six) hours as needed for mild pain (or Fever >/= 101). 07/18/16  Yes Babish, Donnice RIGGERS  aspirin  EC 81 MG tablet Take 81 mg by mouth every morning.   Yes  [provider]  citalopram  (CELEXA ) 40 MG tablet Take 40 mg by mouth daily.   Yes [provider]  diazepam  (VALIUM ) 10 MG tablet Take 10 mg by mouth every 12 (twelve) hours as needed for anxiety.   Yes [provider]  ferrous sulfate  325 (65 FE) MG tablet Take 1 tablet (325 mg total) by mouth 3 (three) times daily after meals. 07/18/16  Yes Danella Donnice, PA-C  levothyroxine  (SYNTHROID ) 88 MCG tablet Take 88 mcg by mouth daily before breakfast. 05/27/24  Yes [provider]  LINZESS  145 MCG CAPS capsule Take 145 mcg by mouth every morning. 04/27/24  Yes [provider]  meloxicam (MOBIC) 7.5 MG tablet Take 7.5 mg by mouth daily. 03/02/24  Yes [provider]  metFORMIN (GLUCOPHAGE-XR) 500 MG 24 hr tablet Take 500 mg by mouth daily. 04/27/24  Yes [provider]  methocarbamol  (ROBAXIN ) 750 MG tablet Take 750-1,500 mg by mouth 3 (three) times daily as needed. 04/27/24  Yes [provider]  omeprazole  (PRILOSEC) 40 MG capsule Take 40 mg by mouth 2 (two) times daily.   Yes [provider]  oxyCODONE -acetaminophen  (PERCOCET) 10-325 MG tablet Take 1 tablet by mouth 4 (four) times daily as needed for pain. 05/25/24  Yes [provider]  polyethylene glycol (MIRALAX  / GLYCOLAX ) packet Take  17 g by mouth 2 (two) times daily. 07/18/16  Yes Babish, Donnice, PA-C  pregabalin  (LYRICA ) 300 MG capsule Take 300 mg by mouth in the morning, at noon, and at bedtime. 04/10/19  Yes [provider]  progesterone (PROMETRIUM) 200 MG capsule Take 200 mg by mouth at bedtime. 06/10/24  Yes [provider]  promethazine (PHENERGAN) 25 MG tablet Take 25 mg by mouth every 6 (six) hours as needed for nausea. 04/10/19  Yes [provider]  rOPINIRole  (REQUIP ) 1 MG tablet Take 1 mg by mouth 3 (three) times daily. 04/10/19  Yes [provider]  rosuvastatin  (CRESTOR ) 20 MG tablet Take 20 mg by mouth at bedtime.   Yes  [provider]  Vitamin D, Ergocalciferol, (DRISDOL) 1.25 MG (50000 UNIT) CAPS capsule Take 50,000 Units by mouth once a week. 04/27/24  Yes [provider]  zolpidem  (AMBIEN ) 10 MG tablet Take 10 mg by mouth daily. 04/10/19  Yes [provider]  clopidogrel  (PLAVIX ) 75 MG tablet Take 75 mg by mouth every morning. Patient not taking: Reported on 06/27/2024    [provider]  OZEMPIC, 1 MG/DOSE, 4 MG/3ML SOPN Inject 1 mg into the skin once a week. Patient not taking: Reported on 06/27/2024 05/21/24   [provider]    Social History   Socioeconomic History   Marital status: Widowed    Spouse name: Not on file   Number of children: Not on file   Years of education: Not on file   Highest education level: Not on file  Occupational History   Not on file  Tobacco Use   Smoking status: Some Days    Current packs/day: 0.25    Average packs/day: 0.3 packs/day for 40.0 years (10.0 ttl pk-yrs)    Types: Cigarettes   Smokeless tobacco: Never  Substance and Sexual Activity   Alcohol use: No   Drug use: No   Sexual activity: Not on file  Other Topics Concern   Not on file  Social History Narrative   Not on file   Social Drivers of Health   Financial Resource Strain: Low Risk  (12/25/2022)   Received from [facility]   Overall Financial Resource Strain (CARDIA)    Difficulty of Paying Living Expenses: Not hard at all  Food Insecurity: No Food Insecurity (06/27/2024)   Hunger Vital Sign    Worried About Running Out of Food in the Last Year: Never true    Ran Out of Food in the Last Year: Never true  Transportation Needs: No Transportation Needs (06/27/2024)   PRAPARE - Administrator, Civil Service (Medical): No    Lack of Transportation (Non-Medical): No  Physical Activity: Inactive (01/17/2022)   Received from [facility]   Exercise Vital Sign    On average, how many days per week do you engage in moderate to strenuous exercise  (like a brisk walk)?: 0 days    On average, how many minutes do you engage in exercise at this level?: 0 min  Stress: Stress Concern Present (11/08/2022)   Received from [facility] of Occupational [facility] - Occupational Stress Questionnaire    Feeling of Stress : To some extent  Social Connections: Socially Isolated (06/27/2024)   Social Connection and Isolation Panel    Frequency of Communication with Friends and Family: Never    Frequency of Social Gatherings with Friends and Family: Never    Attends Religious Services: Never    Active Member  of Clubs or Organizations: No    Attends Banker Meetings: Never    Marital Status: Divorced  Catering manager Violence: Not At Risk (06/27/2024)   Humiliation, Afraid, Rape, and Kick questionnaire    Fear of Current or Ex-Partner: No    Emotionally Abused: No    Physically Abused: No    Sexually Abused: No     History reviewed. No pertinent family history.  ROS: [x]  Positive   [ ]  Negative   [ ]  All sytems reviewed and are negative  Cardiovascular: []  chest pain/pressure []  palpitations []  SOB lying flat []  DOE []  pain in legs while walking []  pain in legs at rest []  pain in legs at night []  non-healing ulcers []  hx of DVT []  swelling in legs  Pulmonary: []  productive cough []  asthma/wheezing []  home O2  Neurologic: []  weakness in []  arms []  legs []  numbness in []  arms []  legs []  hx of CVA []  mini stroke [] difficulty speaking or slurred speech []  temporary loss of vision in one eye []  dizziness  Hematologic: []  hx of cancer []  bleeding problems []  problems with blood clotting easily  Endocrine:   []  diabetes []  thyroid  disease  GI []  vomiting blood []  blood in stool  GU: []  CKD/renal failure []  HD--[]  M/W/F or []  T/T/S []  burning with urination []  blood in urine  Psychiatric: []  anxiety []  depression  Musculoskeletal: []  arthritis []  joint pain  Integumentary: []   rashes []  ulcers  Constitutional: []  fever []  chills   Physical Examination  Vitals:   06/27/24 1523  BP: 120/83  Pulse: 69  Resp: 18  Temp: 97.7 F (36.5 C)  SpO2: 90%   There is no height or weight on file to calculate BMI.  General:  NAD Gait: Not observed HENT: WNL, normocephalic Pulmonary: normal non-labored breathing Cardiac: regular, without  Murmurs, rubs or gallops Abdomen:  soft, NT/ND Vascular Exam/Pulses: No prior neck incision Musculoskeletal: no muscle wasting or atrophy  Neurologic: A&O X 3; Appropriate Affect ; SENSATION: normal; MOTOR FUNCTION:  moving all extremities equally. Speech is fluent/normal   CBC    Component Value Date/Time   WBC 6.3 12/03/2016 1405   RBC 4.34 12/03/2016 1405   HGB 9.7 (L) 12/03/2016 1405   HCT 33.8 (L) 12/03/2016 1405   PLT 377 12/03/2016 1405   MCV 77.9 (L) 12/03/2016 1405   MCH 22.4 (L) 12/03/2016 1405   MCHC 28.7 (L) 12/03/2016 1405   RDW 17.5 (H) 12/03/2016 1405   LYMPHSABS 2.6 07/05/2008 2020   MONOABS 0.5 07/05/2008 2020   EOSABS 0.2 07/05/2008 2020   BASOSABS 0.1 07/05/2008 2020    BMET    Component Value Date/Time   NA 137 12/03/2016 1405   K 4.0 12/03/2016 1405   CL 105 12/03/2016 1405   CO2 24 12/03/2016 1405   GLUCOSE 101 (H) 12/03/2016 1405   BUN 10 12/03/2016 1405   CREATININE 0.66 12/03/2016 1405   CALCIUM  9.2 12/03/2016 1405   GFRNONAA >60 12/03/2016 1405   GFRAA >60 12/03/2016 1405    COAGS: Lab Results  Component Value Date   INR 1.54 07/19/2016   INR 1.20 07/18/2016   INR 1.13 07/17/2016     Non-Invasive Vascular Imaging:    CTA neck reviewed from [facility] with chronic near complete occlusion of the proximal left ICA  MRI Brain reviewed [facility] with no acute ischemia noted  ASSESSMENT/PLAN: This is a 68 y.o. female  with history of coronary artery  disease, hyperlipidemia, atrial fibrillation, coronary artery disease, factor V Leiden with history of DVT  PE that presents as a transfer from [facility] for symptomatic left internal carotid stenosis.  She presented there yesterday with slurred speech and some right facial numbness.  Workup included MRI showed no acute infarct but a critical stenosis of the left internal carotid artery.  CTA head and neck showed chronic appearing near complete occlusion of the proximal left ICA.  I discussed her symptoms are consistent with a TIA with symptomatic high-grade left internal carotid artery stenosis.  I have reviewed her CTA and artery remains patent with near string sign.  I discussed we recommend revascularization for stroke risk reduction.  I have reviewed her CT imaging from [place].  Discussed she is a candidate for traditional carotid endarterectomy or TCAR using stenting and flow reversal.  I discussed both procedures in detail.  I discussed with TCAR she would require dual antiplatelet therapy with aspirin  Plavix  as well as statin for at least 1 month.  Knowing that they went to put her on anticoagulation for history of A-fib she would require triple therapy for at least 1 month.  She would like to go with a more minimally invasive TCAR.  Will schedule for left TCAR on Monday.  Discussed 1% risk of stroke along with other operative risks.  Vascular will follow.  Lonni DOROTHA Gaskins, MD [facility] Office: [phone]  I have completed Share Decision Making with TOPANGA ALVELO prior to surgery.  Conversations included: -Discussion of all treatment options including carotid endarterectomy (CEA), CAS (which includes transcarotid artery revascularization (TCAR)), and optimal medical therapy (OMT)). -Explanation of risks and benefits for each option specific to Erminio JAYSON Boor clinical situation. -Integration of clinical guidelines as it relates to the patient's history and co-morbidities -Discussion and incorporation of AIREL MAGADAN and their personal preferences and  priorities in choosing a treatment plan.    Lonni JINNY Gaskins

## 2024-06-27 NOTE — Progress Notes (Signed)
Attempted Echocardiogram, patient care is in progress.

## 2024-06-27 NOTE — H&P (Signed)
 History and Physical   Laura Hodges FMW:[id number] DOB: 1956-12-11 DOA: 06/27/2024  PCP: Jama Chow, MD   Patient coming from: [place]  Chief Complaint: Carotid Stenosis  HPI: Laura Hodges is a 68 y.o. female with medical history significant of hypertension, hyperlipidemia, atrial fibrillation, CAD status post stent, GERD, RLS, factor V Leiden, DVT/PE, status post IVC filter, depression, anxiety, obesity, COPD, chronic pain, laminectomy presenting from [facility] with left internal carotid critical stenosis.  Patient initially presented to [facility] yesterday with right-sided facial numbness and tingling with slurred speech.  Symptoms largely improved in the ED but still has lip numbness and tingling.  Workup there included CT head which showed no acute abnormality.  CTA head neck which showed chronic appearing near complete occlusion of the proximal left ICA with recommendation for possible carotid ultrasound for follow-up.  MRI brain was done this morning and showed no acute infarct but did show the above critical stenosis of the left carotid artery and radiologist called patient's provider at [facility] and said there was concern for unstable clot and string sign and patient will need evaluated by vascular surgery.  Vascular surgery consulted and agreed to see the patient in evaluation after arrival to [facility] with plan for surgical intervention.  Intervention is to be delayed as patient received a dose of Xarelto  while at [place] this morning.  Has not been on chronic anticoagulation for some time however.  Transition to heparin  prior to discharge from [place].  Patient denies fevers, chills, chest pain, shortness breath, abdominal pain, constipation, diarrhea, nausea, vomiting.  Course: Apart from above imaging.  Vital signs have been stable.  Lab workup without significant abnormality yesterday.  Labs for today not on paper chart.  Was evaluated by teleneurology  there.  Review of Systems: As per HPI otherwise all other systems reviewed and are negative.  Past Medical History:  Diagnosis Date   Anxiety    Arthritis    Coronary artery disease    pt states has 1 stent placed / 4 years ago    DVT (deep venous thrombosis) (HCC)    Dyslipidemia    GERD (gastroesophageal reflux disease)    Presence of IVC filter    Pulmonary embolism (HCC)    Sciatica    Wears glasses     Past Surgical History:  Procedure Laterality Date   ABDOMINAL HYSTERECTOMY     ABDOMINAL SURGERY     ANGIOPLASTY     BACK SURGERY     BREAST SURGERY     GOITER SURGERY     KNEE SURGERY     TOTAL KNEE ARTHROPLASTY Left 07/17/2016   Procedure: TOTAL KNEE ARTHROPLASTY;  Surgeon: Donnice Car, MD;  Location: [facility];  Service: Orthopedics;  Laterality: Left;    Social History  reports that she has been smoking cigarettes. She has a 10 pack-year smoking history. She has never used smokeless tobacco. She reports that she does not drink alcohol and does not use drugs.  Allergies  Allergen Reactions   Doxycycline Other (See Comments)    unknown   Morphine And Codeine Hives    History reviewed. No pertinent family history.   Prior to Admission medications   Medication Sig Start Date End Date Taking? Authorizing Provider  acetaminophen  (TYLENOL ) 325 MG tablet Take 2 tablets (650 mg total) by mouth every 6 (six) hours as needed for mild pain (or Fever >/= 101). 07/18/16   Danella Donnice, PA-C  aspirin  EC 81 MG tablet Take 81  mg by mouth every morning.    [provider]  atorvastatin  (LIPITOR) 80 MG tablet Take 80 mg by mouth every evening.    [provider]  citalopram  (CELEXA ) 40 MG tablet Take 40 mg by mouth daily.    [provider]  clopidogrel  (PLAVIX ) 75 MG tablet Take 75 mg by mouth every morning.    [provider]  diazepam  (VALIUM ) 10 MG tablet Take 10 mg by mouth every 12 (twelve) hours as needed for anxiety.    [provider]  docusate sodium  (COLACE) 100 MG capsule Take 1 capsule (100 mg total) by mouth 2 (two) times daily. 07/18/16   Danella Cough, PA-C  ferrous sulfate  325 (65 FE) MG tablet Take 1 tablet (325 mg total) by mouth 3 (three) times daily after meals. 07/18/16   Danella Cough, PA-C  gabapentin  (NEURONTIN ) 800 MG tablet Take 800 mg by mouth 2 (two) times daily. As needed for pain    [provider]  methocarbamol  (ROBAXIN ) 500 MG tablet Take 1 tablet (500 mg total) by mouth every 6 (six) hours as needed for muscle spasms. 07/18/16   Danella Cough, PA-C  omeprazole  (PRILOSEC) 40 MG capsule Take 40 mg by mouth 2 (two) times daily.    [provider]  oxyCODONE  10 MG TABS Take 1-2 tablets (10-20 mg total) by mouth every 4 (four) hours. 07/18/16   Danella Cough, PA-C  polyethylene glycol (MIRALAX  / GLYCOLAX ) packet Take 17 g by mouth 2 (two) times daily. 07/18/16   Danella Cough, PA-C    Physical Exam: Vitals:   06/27/24 1523  BP: 120/83  Pulse: 69  Resp: 18  Temp: 97.7 F (36.5 C)  TempSrc: Oral  SpO2: 90%    Physical Exam Constitutional:      General: She is not in acute distress.    Appearance: Normal appearance.  HENT:     Head: Normocephalic and atraumatic.     Mouth/Throat:     Mouth: Mucous membranes are moist.     Pharynx: Oropharynx is clear.  Eyes:     Extraocular Movements: Extraocular movements intact.     Pupils: Pupils are equal, round, and reactive to light.  Cardiovascular:     Rate and Rhythm: Normal rate and regular rhythm.     Pulses: Normal pulses.     Heart sounds: Normal heart sounds.  Pulmonary:     Effort: Pulmonary effort is normal. No respiratory distress.     Breath sounds: Normal breath sounds.  Abdominal:     General: Bowel sounds are normal. There is no distension.     Palpations: Abdomen is soft.     Tenderness: There is no abdominal tenderness.  Musculoskeletal:        General: No swelling or deformity.  Skin:     General: Skin is warm and dry.  Neurological:     Comments: Mental Status: Patient is awake, alert, oriented x3 No signs of aphasia or neglect Cranial Nerves: II: Pupils equal, round, and reactive to light.   III,IV, VI: EOMI without ptosis or diploplia.  V: Facial sensation intact with mild perioral numbness and tingling on the right VII: Facial movement is symmetric.  VIII: hearing is intact to voice X: Uvula elevates symmetrically XI: Shoulder shrug is symmetric. XII: tongue is midline without atrophy or fasciculations.  Motor: Good effort thorughout, at Least 5/5 bilateral UE, 5/5 bilateral lower extremitiy  Sensory: Sensation is grossly intact bilateral UEs & LEs Cerebellar: Finger-Nose intact bilalat  Labs on Admission: I have personally reviewed following labs and imaging studies  CBC: No results for input(s): WBC, NEUTROABS, HGB, HCT, MCV, PLT in the last 168 hours.  Basic Metabolic Panel: No results for input(s): NA, K, CL, CO2, GLUCOSE, BUN, CREATININE, CALCIUM , MG, PHOS in the last 168 hours.  GFR: CrCl cannot be calculated (Patient's most recent lab result is older than the maximum 21 days allowed.).  Liver Function Tests: No results for input(s): AST, ALT, ALKPHOS, BILITOT, PROT, ALBUMIN  in the last 168 hours.  Urine analysis: No results found for: COLORURINE, APPEARANCEUR, LABSPEC, PHURINE, GLUCOSEU, HGBUR, BILIRUBINUR, KETONESUR, PROTEINUR, UROBILINOGEN, NITRITE, LEUKOCYTESUR  Radiological Exams on Admission: No results found.  EKG:  not yet available for review.  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, left Active Problems:   OBESITY, NOS   Depression   GASTROESOPHAGEAL REFLUX, NO ESOPHAGITIS   Severe episode of recurrent major depressive disorder, without psychotic features (HCC)   Paroxysmal atrial fibrillation (HCC)   Opioid dependence  (HCC)   History of pulmonary embolus (PE)   History of deep vein thrombosis (DVT) of lower extremity   H/O heart artery stent   [place] filter in place   GAD (generalized anxiety disorder)   Factor V Leiden mutation (HCC)   Essential hypertension   Dyslipidemia   Coronary artery disease involving native coronary artery of native heart without angina pectoris   COPD (chronic obstructive pulmonary disease) (HCC)   Chronic pain syndrome   TIA (transient ischemic attack)   Critical stenosis of left internal carotid artery > Patient transferred from [facility] after imaging showed critical stenosis of the left internal carotid artery with possibility of unstable clot. > Discussed with vascular surgery prior to patient transfer who indicated they would be appropriate for transfer and will need surgical intervention. > Intervention to be delayed after receiving dose of Xarelto  this morning, 7/5, at [facility] > Transitioned to heparin  infusion. - Monitor on telemetry - Vascular surgery notified of patient arrival, will see this evening or tomorrow morning - Continue with IV heparin   TIA > Presented to outside hospital with right facial droop as well as right sided numbness and slurred speech.  Symptoms largely improved other than residual numbness and tingling around patient's lips. > As above, workup there included negative CT head.  CTA head neck negative other than the near complete occlusion of the left ICA.  MRI brain showed no acute abnormality but redemonstrated the critical stenosis above. > Seen by teleneurology there but it does not appear that additional workup had been able to be completed prior to transfer. - Neurology notified of patient arrival. - Neurology consult - Allow for permissive HTN  - No antiplatelet at this time with anticipated surgery - Continue home statin - Echocardiogram  - A1C  - Lipid panel  - Tele monitoring  - SLP eval -  PT/OT  Hypertension - Permissive hypertension for now as above  Hyperlipidemia - Continue home atorvastatin   Atrial fibrillation - Not on anticoagulation - Heparin  as above - Continue home**  CAD - Status post stent - No antiplatelets for now - On heparin  as above - Continue home rosuvastatin   GERD - Continue PPI  Depression Anxiety - Not currently on medication for this  Factor V Leiden History of DVT and PE - Not currently on anticoagulation - On heparin  for stenosis as above - Has filter in place  Chronic pain - Continue home oxycodone   Restless leg syndrome -  Continue home ropinirole   DVT prophylaxis: Heparin  Code Status:   Full Family Communication:  None on admission  Disposition Plan:   Patient is from:  Home  Anticipated DC to:  Home  Anticipated DC date:  3 to 5 days  Anticipated DC barriers: None  Consults called:  Vascular surgery, neurology Admission status:  Inpatient, telemetry  Severity of Illness: The appropriate patient status for this patient is INPATIENT. Inpatient status is judged to be reasonable and necessary in order to provide the required intensity of service to ensure the patient's safety. The patient's presenting symptoms, physical exam findings, and initial radiographic and laboratory data in the context of their chronic comorbidities is felt to place them at high risk for further clinical deterioration. Furthermore, it is not anticipated that the patient will be medically stable for discharge from the hospital within 2 midnights of admission.   * I certify that at the point of admission it is my clinical judgment that the patient will require inpatient hospital care spanning beyond 2 midnights from the point of admission due to high intensity of service, high risk for further deterioration and high frequency of surveillance required.Laura Hodges Laura Seena MD [facility]  How to contact the [facility] Attending or Consulting  provider 7A - 7P or covering provider during after hours 7P -7A, for this patient?   Check the care team in [facility] and look for a) attending/consulting [facility] provider listed and b) the [facility] team listed Log into [email or web address] and use [facility] universal password to access. If you do not have the password, please contact the hospital operator. Locate the [facility] provider you are looking for under [facility] and page to a number that you can be directly reached. If you still have difficulty reaching the provider, please page the [facility] (Director on Call) for the Hospitalists listed on amion for assistance.  06/27/2024, 3:55 PM

## 2024-06-28 ENCOUNTER — Inpatient Hospital Stay (HOSPITAL_COMMUNITY)

## 2024-06-28 DIAGNOSIS — I10 Essential (primary) hypertension: Secondary | ICD-10-CM

## 2024-06-28 DIAGNOSIS — I6522 Occlusion and stenosis of left carotid artery: Secondary | ICD-10-CM

## 2024-06-28 DIAGNOSIS — I63522 Cerebral infarction due to unspecified occlusion or stenosis of left anterior cerebral artery: Secondary | ICD-10-CM

## 2024-06-28 DIAGNOSIS — E785 Hyperlipidemia, unspecified: Secondary | ICD-10-CM

## 2024-06-28 DIAGNOSIS — I63512 Cerebral infarction due to unspecified occlusion or stenosis of left middle cerebral artery: Secondary | ICD-10-CM | POA: Diagnosis not present

## 2024-06-28 DIAGNOSIS — D6851 Activated protein C resistance: Secondary | ICD-10-CM

## 2024-06-28 DIAGNOSIS — Z86718 Personal history of other venous thrombosis and embolism: Secondary | ICD-10-CM

## 2024-06-28 DIAGNOSIS — F17210 Nicotine dependence, cigarettes, uncomplicated: Secondary | ICD-10-CM

## 2024-06-28 DIAGNOSIS — I6389 Other cerebral infarction: Secondary | ICD-10-CM

## 2024-06-28 LAB — COMPREHENSIVE METABOLIC PANEL WITH GFR
ALT: 19 U/L (ref 0–44)
AST: 20 U/L (ref 15–41)
Albumin: 3.4 g/dL — ABNORMAL LOW (ref 3.5–5.0)
Alkaline Phosphatase: 59 U/L (ref 38–126)
Anion gap: 7 (ref 5–15)
BUN: 11 mg/dL (ref 8–23)
CO2: 29 mmol/L (ref 22–32)
Calcium: 9.2 mg/dL (ref 8.9–10.3)
Chloride: 102 mmol/L (ref 98–111)
Creatinine, Ser: 0.83 mg/dL (ref 0.44–1.00)
GFR, Estimated: >60 mL/min (ref >60)
Glucose, Bld: 107 mg/dL — ABNORMAL HIGH (ref 70–99)
Potassium: 4.3 mmol/L (ref 3.5–5.1)
Sodium: 138 mmol/L (ref 135–145)
Total Bilirubin: 0.3 mg/dL (ref 0.0–1.2)
Total Protein: 6.2 g/dL — ABNORMAL LOW (ref 6.5–8.1)

## 2024-06-28 LAB — APTT
aPTT: 46 s — ABNORMAL HIGH (ref 24–36)
aPTT: 36 s (ref 24–36)

## 2024-06-28 LAB — RAPID URINE DRUG SCREEN, HOSP PERFORMED
Amphetamines: NOT DETECTED
Barbiturates: NOT DETECTED
Benzodiazepines: NOT DETECTED
Cocaine: NOT DETECTED
Opiates: POSITIVE — AB
Tetrahydrocannabinol: NOT DETECTED

## 2024-06-28 LAB — CBC
HCT: 41.4 % (ref 36.0–46.0)
Hemoglobin: 13.7 g/dL (ref 12.0–15.0)
MCH: 31.1 pg (ref 26.0–34.0)
MCHC: 33.1 g/dL (ref 30.0–36.0)
MCV: 93.9 fL (ref 80.0–100.0)
Platelets: 202 K/uL (ref 150–400)
RBC: 4.41 MIL/uL (ref 3.87–5.11)
RDW: 12.8 % (ref 11.5–15.5)
WBC: 5.9 K/uL (ref 4.0–10.5)
nRBC: 0 % (ref 0.0–0.2)

## 2024-06-28 LAB — TYPE AND SCREEN
ABO/RH(D): A POS
Antibody Screen: NEGATIVE

## 2024-06-28 LAB — LIPID PANEL
Cholesterol: 153 mg/dL (ref 0–200)
HDL: 36 mg/dL — ABNORMAL LOW (ref >40)
LDL Cholesterol: 62 mg/dL (ref 0–99)
Total CHOL/HDL Ratio: 4.3 ratio
Triglycerides: 275 mg/dL — ABNORMAL HIGH (ref <150)
VLDL: 55 mg/dL — ABNORMAL HIGH (ref 0–40)

## 2024-06-28 LAB — HEPARIN LEVEL (UNFRACTIONATED): Heparin Unfractionated: 1.1 [IU]/mL — ABNORMAL HIGH (ref 0.30–0.70)

## 2024-06-28 LAB — ECHOCARDIOGRAM COMPLETE: Area-P 1/2: 2.56 cm2

## 2024-06-28 LAB — GLUCOSE, CAPILLARY: Glucose-Capillary: 143 mg/dL — ABNORMAL HIGH (ref 70–99)

## 2024-06-28 MED ORDER — METHOCARBAMOL 500 MG PO TABS
750.0000 mg | ORAL_TABLET | Freq: Three times a day (TID) | ORAL | Status: DC | PRN
  Administered 2024-06-28: 1500 mg via ORAL
  Administered 2024-06-28: 750 mg via ORAL
  Administered 2024-06-29: 1500 mg via ORAL
  Administered 2024-06-29 – 2024-06-30 (×2): 1000 mg via ORAL
  Filled 2024-06-28: qty 3
  Filled 2024-06-28 (×3): qty 2
  Filled 2024-06-28: qty 3

## 2024-06-28 MED ORDER — DIAZEPAM 5 MG PO TABS
10.0000 mg | ORAL_TABLET | Freq: Two times a day (BID) | ORAL | Status: DC | PRN
  Administered 2024-06-29 (×2): 10 mg via ORAL
  Filled 2024-06-28 (×2): qty 2

## 2024-06-28 MED ORDER — ZOLPIDEM TARTRATE 5 MG PO TABS
5.0000 mg | ORAL_TABLET | Freq: Every day | ORAL | Status: DC
  Administered 2024-06-28 – 2024-06-30 (×3): 5 mg via ORAL
  Filled 2024-06-28 (×3): qty 1

## 2024-06-28 MED ORDER — NICOTINE POLACRILEX 2 MG MT GUM
2.0000 mg | CHEWING_GUM | OROMUCOSAL | Status: DC | PRN
  Administered 2024-06-28 (×2): 2 mg via ORAL
  Filled 2024-06-28 (×4): qty 1

## 2024-06-28 NOTE — Progress Notes (Signed)
 Pharmacy Consult for Heparin   Indication: Critical carotid artery stenosis w/o infarct   Allergies  Allergen Reactions   Morphine And Codeine Hives   Doxycycline Other (See Comments)    unknown    Patient Measurements:  Heparin  DW : 70 kg     Vital Signs: Temp: 98 F (36.7 C) (07/06 1153) Temp Source: Oral (07/06 1153) BP: 126/74 (07/06 1153) Pulse Rate: 66 (07/06 1709)  Labs: Recent Labs    06/27/24 1819 06/28/24 0410 06/28/24 1841  HGB 14.0 13.7  --   HCT 41.2 41.4  --   PLT 201 202  --   APTT 40*  --  46*  HEPARINUNFRC >1.10*  --  1.10*  CREATININE 0.75 0.83  --     CrCl cannot be calculated (Unknown ideal weight.).  Assessment: Laura Hodges a 68 y.o. female presented with critical carotid artery stenosis. Pt with PMH s/f Factor V Leiden, AF and h/o DVT/PE with IVC filter in place. Previously on [facility], but not for a couple of years. On admission it was recommended by Teleneuro to start Xarelto - she received 1 dose of 20 mg at [facility]- 7/5 0946 (confirmed with pt). Pharmacy has been consulted for heparin  dosing.   7/6 PM: Heparin  level supratherapeutic/falsely elevated d/t recent DOAC use. aPTT subtherapeutic at 46 with heparin  running at 1,000 units/hour. CBC WNL this AM. No signs of bleeding or issues with the heparin  infusion noted. Per neurology review of MRI patient has multiple small infarcts , will adjust heparin  goal accordingly. Planning on TCAR 7/7.    Goal of Therapy:  HL 0.3-0.5 aPTT 66-85 Monitor platelets by anticoagulation protocol: Yes   Plan:  Increase heparin  to 1,150 units/hour  (no bolus) aPTT in 6 hours  CBC/anti-Xa/aPTT daily until anti-Xa and aPTT are correlating   Massie Fila, PharmD Clinical Pharmacist  06/28/2024 7:25 PM

## 2024-06-28 NOTE — Progress Notes (Signed)
 Patient asked about why she is being given her Metformin. I explained to her that we normally place patients on sliding scale while they are in the hospital for better control of the sugars. And they will resume their metformin once they are discharged.   There is not an order for any CBG checks for this patient or sliding scale correction.

## 2024-06-28 NOTE — Plan of Care (Signed)
   Problem: Clinical Measurements: Goal: Diagnostic test results will improve Outcome: Progressing

## 2024-06-28 NOTE — Evaluation (Signed)
 Occupational Therapy Evaluation Patient Details Name: Laura Hodges MRN: [id number] DOB: October 18, 1956 Today's Date: 06/28/2024   History of Present Illness   Laura Hodges is a 68 y.o. female admitted 06/27/24 from [facility] for vascular surgery evaluation of L ICA string sign/critical stenosis. Plan for TCAR tomorrow (7/7). PMHx: HTN, HLD, A-fib not on AC, CAD, GERD, factor V Leiden, DVT/PE, and COPD.     Clinical Impressions At baseline, pt is Independent to Mod I with ADLs and IADLs, performs functional mobility In to Mod I with use of a SPC, and drives. Pt now presents with decreased activity tolerance, mild balance deficits, and decreased safety and independence with functional tasks. Pt currently demonstrates ability to complete ADLs largely with Mod I to Supervision/Set up and performs functional mobility with a [facility] with Supervision for safety. Pt's VSS on RA throughout session. Pt participated well and has good family support. Pt will benefit acute skilled OT services to address deficits outlined below and increase safety and independence with functional tasks. No post-acute skilled OT needs are anticipated at this time.      If plan is discharge home, recommend the following:   A little help with walking and/or transfers;A little help with bathing/dressing/bathroom;Assist for transportation;Help with stairs or ramp for entrance     Functional Status Assessment   Patient has had a recent decline in their functional status and demonstrates the ability to make significant improvements in function in a reasonable and predictable amount of time.     Equipment Recommendations   Other(Comment) (OT recommends shower chair; however, pt declines shower chair at this time.      Recommendations for Other Services         Precautions/Restrictions   Precautions Precautions: Fall Recall of Precautions/Restrictions: Intact Restrictions Weight Bearing Restrictions Per  Provider Order: No     Mobility Bed Mobility Overal bed mobility: Modified Independent                  Transfers Overall transfer level: Needs assistance Equipment used: Straight cane Transfers: Sit to/from Stand, Bed to chair/wheelchair/BSC Sit to Stand: Supervision     Step pivot transfers: Supervision     General transfer comment: Supervision for safety with no physical assist needed and no cues needed for safety      Balance Overall balance assessment: Mild deficits observed, not formally tested                                         ADL either performed or assessed with clinical judgement   ADL Overall ADL's : Needs assistance/impaired Eating/Feeding: Independent;Sitting   Grooming: Supervision/safety;Standing   Upper Body Bathing: Set up;Sitting   Lower Body Bathing: Supervison/ safety;Sitting/lateral leans;Sit to/from stand;Set up   Upper Body Dressing : Modified independent;Sitting   Lower Body Dressing: Supervision/safety;Sitting/lateral leans;Sit to/from stand   Toilet Transfer: Supervision/safety;Ambulation;Regular Toilet;Grab bars (SPC)   Toileting- Clothing Manipulation and Hygiene: Supervision/safety;Sit to/from stand       Functional mobility during ADLs: Supervision/safety;Cane General ADL Comments: Pt reports she feels she is near her baseline PLOF, but slightly more unsteady and more easily fatigued.     Vision Baseline Vision/History: 1 Wears glasses (readers; hx of cataracts with sx) Ability to See in Adequate Light: 0 Adequate Patient Visual Report: No change from baseline       Perception  Praxis         Pertinent Vitals/Pain Pain Assessment Pain Assessment: Faces Faces Pain Scale: Hurts little more (Pt reports chronic back pain and reports this is her baseline.) Pain Location: back Pain Descriptors / Indicators: Aching, Discomfort, Grimacing (in back; pt also reports conitnued numbness in R  side of face present since 7/5 but states it is not painful and feels it is improving) Pain Intervention(s): Limited activity within patient's tolerance, Monitored during session, Repositioned     Extremity/Trunk Assessment Upper Extremity Assessment Upper Extremity Assessment: Left hand dominant;Overall WFL for tasks assessed;LUE deficits/detail LUE Deficits / Details: overall WFL; pt reports hx of shoulder replacement approx 6 years ago   Lower Extremity Assessment Lower Extremity Assessment: Defer to PT evaluation       Communication Communication Communication: No apparent difficulties   Cognition Arousal: Alert Behavior During Therapy: WFL for tasks assessed/performed Cognition: No apparent impairments             OT - Cognition Comments: AAOx4 and pleasant throughout session. Pt reports a high level of general life stress recently due to illnesses of close family members, loss of her husband about a year ago, and recent car issues. OT educated pt in availablity of Chaplain services if desired with pt declining Chaplain services at this time but stating she will let RN or therapist know if she changes her mind.                 Following commands: Intact       Cueing  General Comments   Cueing Techniques: Verbal cues  VSS on RA throughout session   Exercises     Shoulder Instructions      Home Living Family/patient expects to be discharged to:: Private residence Living Arrangements: Other relatives;Non-relatives/Friends (brother, sister, roommate) Available Help at Discharge: Family;Friend(s);Available 24 hours/day Type of Home: Mobile home Home Access: Stairs to enter Entrance Stairs-Number of Steps: 3 Entrance Stairs-Rails: Right;Left;Can reach both Home Layout: One level     Bathroom Shower/Tub: Chief Strategy Officer: Standard     Home Equipment: Rollator (4 wheels);Cane - single point   Additional Comments: Pt is currently  visiting a close family friends and will go back to friend's house for a few days post-discharge. Friend's house is one level with 2-3 steps to enter without hand rails and with a tub/shower combo. Friend can provide 24/7 assist.      Prior Functioning/Environment Prior Level of Function : Independent/Modified Independent;Driving             Mobility Comments: Ind to Mod I with SPC. Pt reports fear of falling due to having 1 fall in shower approx 2 years ago ADLs Comments: Ind to Mod I with ADLs and IADLs; drives; enjoys working in her garden    OT Problem List: Decreased activity tolerance;Impaired balance (sitting and/or standing)   OT Treatment/Interventions: Self-care/ADL training;Energy conservation;DME and/or AE instruction;Therapeutic activities;Patient/family education;Balance training      OT Goals(Current goals can be found in the care plan section)   Acute Rehab OT Goals Patient Stated Goal: for TCAR to go well tomorrow and to return home OT Goal Formulation: With patient Time For Goal Achievement: 07/12/24 Potential to Achieve Goals: Good ADL Goals Pt Will Perform Grooming: with modified independence;standing Pt Will Perform Lower Body Bathing: with modified independence;sitting/lateral leans;sit to/from stand Pt Will Perform Lower Body Dressing: with modified independence;sitting/lateral leans;sit to/from stand Pt Will Transfer to Toilet: with modified independence;ambulating;regular height toilet (  with least restrictive AD) Pt Will Perform Tub/Shower Transfer: Tub transfer;with modified independence;ambulating;shower seat (with least restrictive AD) Additional ADL Goal #1: Patient will demonstrate ability to Independently state 3 energy conservation strategies for increased safety and independence with functional tasks.   OT Frequency:  Min 2X/week    Co-evaluation              AM-PAC OT 6 Clicks Daily Activity     Outcome Measure Help from another  person eating meals?: None Help from another person taking care of personal grooming?: A Little Help from another person toileting, which includes using toliet, bedpan, or urinal?: A Little Help from another person bathing (including washing, rinsing, drying)?: A Little Help from another person to put on and taking off regular upper body clothing?: None Help from another person to put on and taking off regular lower body clothing?: A Little 6 Click Score: 20   End of Session Equipment Utilized During Treatment: Gait belt;Other (comment) [facility]) Nurse Communication: Mobility status  Activity Tolerance: Patient tolerated treatment well Patient left: in bed;with call bell/phone within reach  OT Visit Diagnosis: Unsteadiness on feet (R26.81);Other (comment) (decreased activity tolerance)                Time: 8594-8563 OT Time Calculation (min): 31 min Charges:  OT General Charges $OT Visit: 1 Visit OT Evaluation $OT Eval Low Complexity: 1 Low OT Treatments $Self Care/Home Management : 8-22 mins  Margarie Rockey HERO., OTR/L, MA Acute Rehab [phone]   Margarie FORBES Horns 06/28/2024, 5:45 PM

## 2024-06-28 NOTE — Progress Notes (Signed)
 PT Cancellation Note  Patient Details Name: NIXIE LAUBE MRN: [id number] DOB: [birth year]   Cancelled Treatment:    Reason Eval/Treat Not Completed: Other (comment) (PT consult appreciated and chart reviewed. Pt is scheduled for TCAR tomorrow. Will follow-up after procedure to complete PT evaluation.)  Randall SAUNDERS, PT, DPT Acute Rehabilitation Services Office: [phone] Secure Chat Preferred  Delon CHRISTELLA Callander 06/28/2024, 4:27 PM

## 2024-06-28 NOTE — Progress Notes (Signed)
 SLP Cancellation Note  Patient Details Name: Laura Hodges MRN: [id number] DOB: [birth year]   Cancelled treatment:       Reason Eval/Treat Not Completed: SLP screened, no needs identified, will sign off. MRI negative, symptoms resolved. No need for SLP intervention at this time.    Ruther Ephraim, Consuelo Fitch 06/28/2024, 9:39 AM

## 2024-06-28 NOTE — Progress Notes (Addendum)
 STROKE TEAM PROGRESS NOTE   SUBJECTIVE (INTERVAL HISTORY) Her RN is at the bedside.  Overall her condition is completely resolved. Pt currently has no complains. No focal deficit seen. Plan for L TCAR tomorrow with Dr. Gretta.   Regarding the hx of afib, she stated that many years ago she had DVT and PE, she was told to have afib at that time. Since then she did not follow up with any cardiology and she did not feel any irregular heart beat. She was followed by Dr. Edwyna in [place] for CAD s/p stenting in 2014 and did not mention about afib. According to note, she had hx of DVT and PE as well as IVC filter with factor V Leiden mutation and was on long term warfarin but has stopped for a couple of years. Now she is on heparin  IV.    OBJECTIVE Temp:  [97.7 F (36.5 C)-98.3 F (36.8 C)] 98.2 F (36.8 C) (07/06 0235) Pulse Rate:  [62-73] 64 (07/06 1002) Cardiac Rhythm: Normal sinus rhythm (07/05 2300) Resp:  [15-19] 18 (07/06 1002) BP: (96-149)/(52-96) 122/70 (07/06 1002) SpO2:  [90 %-98 %] 97 % (07/06 1002)  No results for input(s): GLUCAP in the last 168 hours. Recent Labs  Lab 06/27/24 1819 06/28/24 0410  NA 139 138  K 4.0 4.3  CL 104 102  CO2 26 29  GLUCOSE 103* 107*  BUN 12 11  CREATININE 0.75 0.83  CALCIUM  9.2 9.2   Recent Labs  Lab 06/27/24 1819 06/28/24 0410  AST 22 20  ALT 18 19  ALKPHOS 69 59  BILITOT 0.4 0.3  PROT 6.6 6.2*  ALBUMIN  3.7 3.4*   Recent Labs  Lab 06/27/24 1819 06/28/24 0410  WBC 5.6 5.9  HGB 14.0 13.7  HCT 41.2 41.4  MCV 94.3 93.9  PLT 201 202   No results for input(s): CKTOTAL, CKMB, CKMBINDEX, TROPONINI in the last 168 hours. No results for input(s): LABPROT, INR in the last 72 hours. No results for input(s): COLORURINE, LABSPEC, PHURINE, GLUCOSEU, HGBUR, BILIRUBINUR, KETONESUR, PROTEINUR, UROBILINOGEN, NITRITE, LEUKOCYTESUR in the last 72 hours.  Invalid input(s): APPERANCEUR     Component  Value Date/Time   CHOL 153 06/28/2024 0410   TRIG 275 (H) 06/28/2024 0410   HDL 36 (L) 06/28/2024 0410   CHOLHDL 4.3 06/28/2024 0410   VLDL 55 (H) 06/28/2024 0410   LDLCALC 62 06/28/2024 0410   Lab Results  Component Value Date   HGBA1C 5.0 06/27/2024      Component Value Date/Time   LABOPIA POSITIVE (A) 06/28/2024 0916   COCAINSCRNUR NONE DETECTED 06/28/2024 0916   LABBENZ NONE DETECTED 06/28/2024 0916   AMPHETMU NONE DETECTED 06/28/2024 0916   THCU NONE DETECTED 06/28/2024 0916   LABBARB NONE DETECTED 06/28/2024 0916    No results for input(s): ETH in the last 168 hours.  I have personally reviewed the radiological images below and agree with the radiology interpretations.  No results found.   PHYSICAL EXAM  Temp:  [97.7 F (36.5 C)-98.3 F (36.8 C)] 98.2 F (36.8 C) (07/06 0235) Pulse Rate:  [62-73] 64 (07/06 1002) Resp:  [15-19] 18 (07/06 1002) BP: (96-149)/(52-96) 122/70 (07/06 1002) SpO2:  [90 %-98 %] 97 % (07/06 1002)  General - Well nourished, well developed, in no apparent distress.  Ophthalmologic - fundi not visualized due to noncooperation.  Cardiovascular - Regular rhythm and rate.  Mental Status -  Level of arousal and orientation to time, place, and person were intact. Language including expression, naming,  repetition, comprehension was assessed and found intact. Fund of Knowledge was assessed and was intact.  Cranial Nerves II - XII - II - Visual field intact OU. III, IV, VI - Extraocular movements intact. V - Facial sensation intact bilaterally. VII - Facial movement intact bilaterally. VIII - Hearing & vestibular intact bilaterally. X - Palate elevates symmetrically. XI - Chin turning & shoulder shrug intact bilaterally. XII - Tongue protrusion intact.  Motor Strength - The patient's strength was normal in all extremities and pronator drift was absent.  Bulk was normal and fasciculations were absent.   Motor Tone - Muscle tone was  assessed at the neck and appendages and was normal.  Reflexes - The patient's reflexes were symmetrical in all extremities and she had no pathological reflexes.  Sensory - Light touch, temperature/pinprick were assessed and were symmetrical.    Coordination - The patient had normal movements in the hands and feet with no ataxia or dysmetria.  Tremor was absent.  Gait and Station - deferred.   ASSESSMENT/PLAN [name] is a 68 y.o. female with history of HTN, HLD, DVT/PE with factor V Leiden mutation, CAD s/p stenting in 2014, COPD and smoker admitted for right facial droop and numbness, with slurry speech. No TNK given due to symptoms resolved.    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  Patient counseled to be compliant with her antithrombotic medications Ongoing aggressive stroke risk factor management Therapy recommendations:  pending Disposition:  pending  Left ICA high grade stenosis CTA head and neck left ICA string sign Likely symptomatic left ICA stenosis VVS on board Plan for left TCAR tomorrow with Dr. Gretta On DAPT   Hx of DVT/PE Factor V Leiden mutation According to the note from Dr. Raylene on 04/10/2019, pt had factor V leiden mutation and it was likely the cause of her DVT and PE S/p IVC filter in the past Was on long term warfarin but seems stopped a couple of years ago Now on heparin  IV  Hx of afib - ? lone afib in the setting of PE many years ago No clear description of afib hx  Per pt, she was told to have afib at the time of DVT/PE.  Since  then she did not follow up with any cardiology and she did not feel any irregular heart beat. She was followed by Dr. Edwyna in [place] in 2017 for CAD s/p stenting in 2014 and did not mention about afib.  She saw Dr. Raylene cardiology at [facility] in 03/2019 and did not mention about afib Tele monitoring this time showed NSR Now on heparin  IV  Hypertension Stable BP goal 130-160 prior to carotid vascularization  Long term BP goal normotensive  Hyperlipidemia Home meds:  crestor  20  LDL 62, goal < 70 Now on crestor  40 Continue statin at discharge  Tobacco abuse Current smoker Smoking cessation counseling provided Nicotine  patch provided Pt is willing to quit  Other Stroke Risk Factors Advanced age Coronary artery disease s/p stenting 2014  Other Active Problems COPD  Hospital  day # 1    Ary Cummins, MD PhD Stroke Neurology 06/28/2024 10:26 AM    To contact Stroke Continuity provider, please refer to [email or web address]. After hours, contact General Neurology

## 2024-06-28 NOTE — Progress Notes (Signed)
 KENT BRAUNSCHWEIG  FMW:[id number] DOB: 19-Jul-1956 DOA: 06/27/2024 PCP: Jama Chow, MD    Brief Narrative:  68 year old with a history of HTN, HLD, atrial fibrillation, CAD status post stenting, RLS, factor V Leiden with DVT/PE status post IVC filter, depression, anxiety, COPD, obesity, and chronic low back pain who initially presented to [facility] 7/4 with right-sided facial numbness and tingling with slurred speech.  CT head revealed no acute abnormality.  CTa head and neck revealed chronic appearing near complete occlusion of the proximal left ICA.  MRI revealed no acute CVA but again confirmed critical stenosis of the left carotid artery with potential evidence of a clot and string sign.  She was transferred to [facility] for Vasc Surgery evaluation.  Goals of Care:   Code Status: Full Code   DVT prophylaxis: IV heparin   Interim Hx: Afebrile since admission.  Vital signs stable.  Resting comfortably in bed.  No new complaints.  Denies chest pain or shortness of breath.  Reports that her neurologic symptoms have not returned.  Assessment & Plan:  Symptomatic critical left ICA stenosis VVS planning for TCAR on Monday  TIA Presented initially with right facial droop, right facial numbness, and slurring of speech -MRI brain w/o evidence of CVA but noted critical LCA stenosis -CTa head and neck noted short segment chronic appearing near complete occlusion of the proximal left ICA -TTE notes EF 60-65% with no WMA and normal diastolic function -medical treatment: Aspirin  plus Plavix  -LDL 62 -A1c 5.0 -PT/OT/SLP -Allow for permissive hypertension  HTN Blood pressure within goal range at present  HLD Continue usual home medical therapy with LDL 62  Chronic atrial fibrillation Has not been on anticoagulation for approximately 4 years  Factor V Leiden mutation with history of DVT and PE Has IVC filter -has not been on anticoagulation for approximately 4 years, initially due to  financial constraints, and then was told by her PCP that she did not need it anymore per her history  Tobacco abuse Actively smoking approximately 1 pack/day  Chronic pain Continue usual oxycodone  dose  Depression/anxiety Continue usual home medications  RLS Continue usual home ropinirole  dose   Family Communication: No family present at time of exam Disposition: Will depend upon performance with therapy after surgery -suspect she will be discharged home   Objective: Blood pressure 134/62, pulse 62, temperature 98.2 F (36.8 C), temperature source Oral, resp. rate 15, SpO2 94%.  Intake/Output Summary (Last 24 hours) at 06/28/2024 0900 Last data filed at 06/28/2024 0021 Gross per 24 hour  Intake 0 ml  Output --  Net 0 ml   There were no vitals filed for this visit.  Examination: General: No acute respiratory distress Lungs: Clear to auscultation bilaterally without wheezes or crackles Cardiovascular: Regular rate and rhythm without murmur gallop or rub normal S1 and S2 Abdomen: Nontender, nondistended, soft, bowel sounds positive, no rebound, no ascites, no appreciable mass Extremities: No significant cyanosis, clubbing, or edema bilateral lower extremities  CBC: Recent Labs  Lab 06/27/24 1819 06/28/24 0410  WBC 5.6 5.9  HGB 14.0 13.7  HCT 41.2 41.4  MCV 94.3 93.9  PLT 201 202   Basic Metabolic Panel: Recent Labs  Lab 06/27/24 1819 06/28/24 0410  NA 139 138  K 4.0 4.3  CL 104 102  CO2 26 29  GLUCOSE 103* 107*  BUN 12 11  CREATININE 0.75 0.83  CALCIUM  9.2 9.2   GFR: CrCl cannot be calculated (Unknown ideal weight.).   Scheduled Meds:  stroke: early stages of recovery book   Does not apply Once   aspirin   81 mg Oral Daily   Chlorhexidine  Gluconate Cloth  6 each Topical Q0600   clopidogrel   75 mg Oral Daily   levothyroxine   88 mcg Oral QAC breakfast   linaclotide   145 mcg Oral q morning   nicotine   21 mg Transdermal Daily   pantoprazole   40 mg  Oral Daily   pregabalin   225 mg Oral TID   rOPINIRole   1 mg Oral TID   rosuvastatin   40 mg Oral Daily   sodium chloride  flush  3 mL Intravenous Q12H   Continuous Infusions:  heparin        LOS: 1 day   Reyes IVAR Moores, MD [facility] Office  [phone] Pager - Text Page per Amion  If 7PM-7AM, please contact night-coverage per Amion 06/28/2024, 9:00 AM

## 2024-06-28 NOTE — Progress Notes (Signed)
 Echocardiogram 2D Echocardiogram has been performed.  Laura Hodges Laura Hodges RDCS 06/28/2024, 9:37 AM

## 2024-06-28 NOTE — Progress Notes (Addendum)
  Progress Note    06/28/2024 7:57 AM * No surgery found *  Subjective:  no new overnight events    Vitals:   06/28/24 0235 06/28/24 0741  BP: (!) 149/85 134/62  Pulse: 73 62  Resp: 19 15  Temp: 98.2 F (36.8 C)   SpO2: 98% 94%    Physical Exam: General:  laying in bed, NAD Cardiac:  regular Lungs:  nonlabored Extremities:  moving all extremities equally Neuro: no slurred speech or lip droop   CBC    Component Value Date/Time   WBC 5.9 06/28/2024 0410   RBC 4.41 06/28/2024 0410   HGB 13.7 06/28/2024 0410   HCT 41.4 06/28/2024 0410   PLT 202 06/28/2024 0410   MCV 93.9 06/28/2024 0410   MCH 31.1 06/28/2024 0410   MCHC 33.1 06/28/2024 0410   RDW 12.8 06/28/2024 0410   LYMPHSABS 2.6 07/05/2008 2020   MONOABS 0.5 07/05/2008 2020   EOSABS 0.2 07/05/2008 2020   BASOSABS 0.1 07/05/2008 2020    BMET    Component Value Date/Time   NA 138 06/28/2024 0410   K 4.3 06/28/2024 0410   CL 102 06/28/2024 0410   CO2 29 06/28/2024 0410   GLUCOSE 107 (H) 06/28/2024 0410   BUN 11 06/28/2024 0410   CREATININE 0.83 06/28/2024 0410   CALCIUM  9.2 06/28/2024 0410   GFRNONAA >60 06/28/2024 0410   GFRAA >60 12/03/2016 1405    INR    Component Value Date/Time   INR 1.54 07/19/2016 0417     Intake/Output Summary (Last 24 hours) at 06/28/2024 0757 Last data filed at 06/28/2024 0021 Gross per 24 hour  Intake 0 ml  Output --  Net 0 ml      Assessment/Plan:  68 y.o. female with symptomatic left carotid stenosis   -She is doing well this morning without any complaints -She is neuro intact on exam without slurred speech, weakness, numbness, or facial droop -She denies any recurrent facial numbness  -Current plans are for left TCAR tomorrow with Dr.Channa Hazelett for symptomatic carotid stenosis. She remains agreeable for surgery. All questions answered -Will place consent order and make NPO at midnight   [facility], PA-C [facility] [phone] 06/28/2024 7:57 AM  I have seen and evaluated the patient. I agree with the PA note as documented above.  Left TCAR tomorrow for high-grade symptomatic left internal carotid artery stenosis with TIA.  Continue aspirin  Plavix  statin for carotid stenting procedure tomorrow.  Risk benefits discussed including 1% stroke risk.  N.p.o. at midnight.  Consent order placed.  I also discussed options of carotid endarterectomy versus medical therapy and she has elected for TCAR.  Lonni DOROTHA Gaskins, MD [facility] [place] Office: [phone]

## 2024-06-29 ENCOUNTER — Inpatient Hospital Stay (HOSPITAL_COMMUNITY)

## 2024-06-29 ENCOUNTER — Other Ambulatory Visit: Payer: Self-pay

## 2024-06-29 ENCOUNTER — Encounter (HOSPITAL_COMMUNITY): Payer: Self-pay | Admitting: Internal Medicine

## 2024-06-29 ENCOUNTER — Encounter (HOSPITAL_COMMUNITY)
Admission: EM | Disposition: A | Discharge status: Home / Self Care | Payer: Self-pay | Source: Other Acute Inpatient Hospital | Attending: Internal Medicine

## 2024-06-29 DIAGNOSIS — F17210 Nicotine dependence, cigarettes, uncomplicated: Secondary | ICD-10-CM

## 2024-06-29 DIAGNOSIS — I10 Essential (primary) hypertension: Secondary | ICD-10-CM | POA: Diagnosis not present

## 2024-06-29 DIAGNOSIS — I2510 Atherosclerotic heart disease of native coronary artery without angina pectoris: Secondary | ICD-10-CM | POA: Diagnosis not present

## 2024-06-29 DIAGNOSIS — I6522 Occlusion and stenosis of left carotid artery: Secondary | ICD-10-CM

## 2024-06-29 HISTORY — PX: ULTRASOUND GUIDANCE FOR VASCULAR ACCESS: SHX6516

## 2024-06-29 HISTORY — PX: TRANSCAROTID ARTERY REVASCULARIZATIONÂ: SHX6778

## 2024-06-29 LAB — CBC
HCT: 38.1 % (ref 36.0–46.0)
Hemoglobin: 12.5 g/dL (ref 12.0–15.0)
MCH: 31.3 pg (ref 26.0–34.0)
MCHC: 32.8 g/dL (ref 30.0–36.0)
MCV: 95.3 fL (ref 80.0–100.0)
Platelets: 182 K/uL (ref 150–400)
RBC: 4 MIL/uL (ref 3.87–5.11)
RDW: 12.8 % (ref 11.5–15.5)
WBC: 5 K/uL (ref 4.0–10.5)
nRBC: 0 % (ref 0.0–0.2)

## 2024-06-29 LAB — GLUCOSE, CAPILLARY
Glucose-Capillary: 114 mg/dL — ABNORMAL HIGH (ref 70–99)
Glucose-Capillary: 117 mg/dL — ABNORMAL HIGH (ref 70–99)
Glucose-Capillary: 203 mg/dL — ABNORMAL HIGH (ref 70–99)

## 2024-06-29 LAB — POCT ACTIVATED CLOTTING TIME: Activated Clotting Time: 296 s

## 2024-06-29 LAB — SURGICAL PCR SCREEN
MRSA, PCR: NEGATIVE
Staphylococcus aureus: NEGATIVE

## 2024-06-29 LAB — APTT
aPTT: >200 s (ref 24–36)
aPTT: 56 s — ABNORMAL HIGH (ref 24–36)

## 2024-06-29 LAB — ABO/RH: ABO/RH(D): A POS

## 2024-06-29 SURGERY — TRANSCAROTID ARTERY REVASCULARIZATION (TCAR)
Anesthesia: General | Site: Groin | Laterality: Right

## 2024-06-29 MED ORDER — PROPOFOL 10 MG/ML IV BOLUS
INTRAVENOUS | Status: AC
  Filled 2024-06-29: qty 20

## 2024-06-29 MED ORDER — METOPROLOL TARTRATE 5 MG/5ML IV SOLN: 2.5000 mg | INTRAVENOUS | Status: DC | PRN

## 2024-06-29 MED ORDER — SODIUM CHLORIDE 0.9 % IV SOLN: 500.0000 mL | Freq: Once | INTRAVENOUS | Status: DC | PRN

## 2024-06-29 MED ORDER — LIDOCAINE 2% (20 MG/ML) 5 ML SYRINGE
INTRAMUSCULAR | Status: DC | PRN
  Administered 2024-06-29: 60 mg via INTRAVENOUS

## 2024-06-29 MED ORDER — LACTATED RINGERS IV SOLN: INTRAVENOUS | Status: DC

## 2024-06-29 MED ORDER — IODIXANOL 320 MG/ML IV SOLN
INTRAVENOUS | Status: DC | PRN
  Administered 2024-06-29: 20 mL via INTRA_ARTERIAL

## 2024-06-29 MED ORDER — ROCURONIUM BROMIDE 10 MG/ML (PF) SYRINGE
PREFILLED_SYRINGE | INTRAVENOUS | Status: DC | PRN
  Administered 2024-06-29: 60 mg via INTRAVENOUS

## 2024-06-29 MED ORDER — SODIUM CHLORIDE (PF) 0.9 % IJ SOLN
INTRAMUSCULAR | Status: AC
  Filled 2024-06-29: qty 20

## 2024-06-29 MED ORDER — HYDROMORPHONE HCL 1 MG/ML IJ SOLN
0.5000 mg | INTRAMUSCULAR | Status: DC | PRN
  Administered 2024-06-29 (×3): 0.5 mg via INTRAVENOUS
  Filled 2024-06-29 (×3): qty 0.5

## 2024-06-29 MED ORDER — SUGAMMADEX SODIUM 200 MG/2ML IV SOLN
INTRAVENOUS | Status: DC | PRN
  Administered 2024-06-29: 172 mg via INTRAVENOUS

## 2024-06-29 MED ORDER — CEFAZOLIN SODIUM-DEXTROSE 2-4 GM/100ML-% IV SOLN
2.0000 g | Freq: Three times a day (TID) | INTRAVENOUS | Status: AC
  Administered 2024-06-29 – 2024-06-30 (×2): 2 g via INTRAVENOUS
  Filled 2024-06-29 (×2): qty 100

## 2024-06-29 MED ORDER — PHENYLEPHRINE HCL (PRESSORS) 10 MG/ML IV SOLN
INTRAVENOUS | Status: DC | PRN
  Administered 2024-06-29: 80 ug via INTRAVENOUS
  Administered 2024-06-29: 160 ug via INTRAVENOUS

## 2024-06-29 MED ORDER — ORAL CARE MOUTH RINSE: 15.0000 mL | Freq: Once | OROMUCOSAL | Status: AC

## 2024-06-29 MED ORDER — HEPARIN 6000 UNIT IRRIGATION SOLUTION
Status: DC | PRN
Start: 2024-06-29 — End: 2024-06-29
  Administered 2024-06-29: 1

## 2024-06-29 MED ORDER — VASOPRESSIN 20 UNIT/ML IV SOLN
INTRAVENOUS | Status: AC
Start: 2024-06-29 — End: 2024-06-29
  Filled 2024-06-29: qty 1

## 2024-06-29 MED ORDER — POTASSIUM CHLORIDE CRYS ER 20 MEQ PO TBCR: 40.0000 meq | EXTENDED_RELEASE_TABLET | Freq: Every day | ORAL | Status: DC | PRN

## 2024-06-29 MED ORDER — PHENYLEPHRINE HCL-NACL 20-0.9 MG/250ML-% IV SOLN: INTRAVENOUS | Status: DC | PRN

## 2024-06-29 MED ORDER — DOCUSATE SODIUM 100 MG PO CAPS
100.0000 mg | ORAL_CAPSULE | Freq: Every day | ORAL | Status: DC
  Administered 2024-06-30: 100 mg via ORAL
  Filled 2024-06-29: qty 1

## 2024-06-29 MED ORDER — FENTANYL CITRATE (PF) 250 MCG/5ML IJ SOLN
INTRAMUSCULAR | Status: AC
Start: 2024-06-29 — End: 2024-06-29
  Filled 2024-06-29: qty 5

## 2024-06-29 MED ORDER — EPHEDRINE SULFATE-NACL 50-0.9 MG/10ML-% IV SOSY
PREFILLED_SYRINGE | INTRAVENOUS | Status: DC | PRN
  Administered 2024-06-29: 15 mg via INTRAVENOUS

## 2024-06-29 MED ORDER — ONDANSETRON HCL 4 MG/2ML IJ SOLN
INTRAMUSCULAR | Status: DC | PRN
  Administered 2024-06-29: 4 mg via INTRAVENOUS

## 2024-06-29 MED ORDER — HEPARIN SODIUM (PORCINE) 1000 UNIT/ML IJ SOLN
INTRAMUSCULAR | Status: DC | PRN
  Administered 2024-06-29: 10000 [IU] via INTRAVENOUS

## 2024-06-29 MED ORDER — ONDANSETRON HCL 4 MG/2ML IJ SOLN: 4.0000 mg | Freq: Four times a day (QID) | INTRAMUSCULAR | Status: DC | PRN

## 2024-06-29 MED ORDER — CEFAZOLIN SODIUM-DEXTROSE 2-3 GM-%(50ML) IV SOLR
INTRAVENOUS | Status: DC | PRN
  Administered 2024-06-29: 2 g via INTRAVENOUS

## 2024-06-29 MED ORDER — PROPOFOL 10 MG/ML IV BOLUS
INTRAVENOUS | Status: DC | PRN
  Administered 2024-06-29: 150 mg via INTRAVENOUS
  Administered 2024-06-29: 50 mg via INTRAVENOUS

## 2024-06-29 MED ORDER — LABETALOL HCL 5 MG/ML IV SOLN: 10.0000 mg | INTRAVENOUS | Status: DC | PRN

## 2024-06-29 MED ORDER — HYDRALAZINE HCL 20 MG/ML IJ SOLN: 5.0000 mg | INTRAMUSCULAR | Status: DC | PRN

## 2024-06-29 MED ORDER — FENTANYL CITRATE (PF) 100 MCG/2ML IJ SOLN
25.0000 ug | INTRAMUSCULAR | Status: DC | PRN
  Administered 2024-06-29 (×2): 50 ug via INTRAVENOUS

## 2024-06-29 MED ORDER — CHLORHEXIDINE GLUCONATE 0.12 % MT SOLN
OROMUCOSAL | Status: AC
  Administered 2024-06-29: 15 mL via OROMUCOSAL
  Filled 2024-06-29: qty 15

## 2024-06-29 MED ORDER — CLEVIDIPINE BUTYRATE 0.5 MG/ML IV EMUL
INTRAVENOUS | Status: DC | PRN
  Administered 2024-06-29: 3 mg/h via INTRAVENOUS

## 2024-06-29 MED ORDER — ALBUMIN HUMAN 5 % IV SOLN
INTRAVENOUS | Status: AC
  Filled 2024-06-29: qty 250

## 2024-06-29 MED ORDER — PHENYLEPHRINE 80 MCG/ML (10ML) SYRINGE FOR IV PUSH (FOR BLOOD PRESSURE SUPPORT)
PREFILLED_SYRINGE | INTRAVENOUS | Status: DC | PRN
  Administered 2024-06-29 (×3): 80 ug via INTRAVENOUS

## 2024-06-29 MED ORDER — INSULIN ASPART 100 UNIT/ML IJ SOLN: 0.0000 [IU] | Freq: Three times a day (TID) | INTRAMUSCULAR | Status: DC

## 2024-06-29 MED ORDER — HEMOSTATIC AGENTS (NO CHARGE) OPTIME
TOPICAL | Status: DC | PRN
  Administered 2024-06-29: 1 via TOPICAL

## 2024-06-29 MED ORDER — GLYCOPYRROLATE 0.2 MG/ML IJ SOLN
INTRAMUSCULAR | Status: DC | PRN
  Administered 2024-06-29: .2 mg via INTRAVENOUS

## 2024-06-29 MED ORDER — 0.9 % SODIUM CHLORIDE (POUR BTL) OPTIME
TOPICAL | Status: DC | PRN
  Administered 2024-06-29: 1000 mL

## 2024-06-29 MED ORDER — PHENYLEPHRINE HCL-NACL 20-0.9 MG/250ML-% IV SOLN
INTRAVENOUS | Status: DC | PRN
  Administered 2024-06-29: 50 ug/min via INTRAVENOUS

## 2024-06-29 MED ORDER — CHLORHEXIDINE GLUCONATE 0.12 % MT SOLN: 15.0000 mL | Freq: Once | OROMUCOSAL | Status: AC

## 2024-06-29 MED ORDER — LABETALOL HCL 5 MG/ML IV SOLN
INTRAVENOUS | Status: DC | PRN
  Administered 2024-06-29: 10 mg via INTRAVENOUS

## 2024-06-29 MED ORDER — SODIUM CHLORIDE 0.9 % IV SOLN: INTRAVENOUS | Status: DC

## 2024-06-29 MED ORDER — FENTANYL CITRATE (PF) 250 MCG/5ML IJ SOLN
INTRAMUSCULAR | Status: DC | PRN
  Administered 2024-06-29 (×4): 50 ug via INTRAVENOUS

## 2024-06-29 MED ORDER — ALBUMIN HUMAN 5 % IV SOLN
12.5000 g | Freq: Once | INTRAVENOUS | Status: AC
  Administered 2024-06-29: 12.5 g via INTRAVENOUS

## 2024-06-29 MED ORDER — DEXAMETHASONE SODIUM PHOSPHATE 10 MG/ML IJ SOLN
INTRAMUSCULAR | Status: DC | PRN
  Administered 2024-06-29: 10 mg via INTRAVENOUS

## 2024-06-29 MED ORDER — PROTAMINE SULFATE 10 MG/ML IV SOLN
INTRAVENOUS | Status: DC | PRN
  Administered 2024-06-29 (×2): 25 mg via INTRAVENOUS

## 2024-06-29 MED ORDER — FENTANYL CITRATE (PF) 100 MCG/2ML IJ SOLN
INTRAMUSCULAR | Status: AC
Start: 2024-06-29 — End: 2024-06-29
  Filled 2024-06-29: qty 2

## 2024-06-29 SURGICAL SUPPLY — 37 items
BAG BANDED W/RUBBER/TAPE 36X54 (MISCELLANEOUS) ×3 IMPLANT
BAG COUNTER SPONGE SURGICOUNT (BAG) ×3 IMPLANT
CANISTER SUCTION 3000ML PPV (SUCTIONS) ×3 IMPLANT
CATH BALLN ENROUTE 6X35 (CATHETERS) IMPLANT
CLIP TI MEDIUM 6 (CLIP) ×3 IMPLANT
CLIP TI WIDE RED SMALL 6 (CLIP) ×3 IMPLANT
COVER DOME SNAP 22 D (MISCELLANEOUS) ×3 IMPLANT
COVER PROBE W GEL 5X96 (DRAPES) ×3 IMPLANT
DERMABOND ADVANCED .7 DNX12 (GAUZE/BANDAGES/DRESSINGS) ×3 IMPLANT
DRAPE FEMORAL ANGIO 80X135IN (DRAPES) ×3 IMPLANT
ELECTRODE REM PT RTRN 9FT ADLT (ELECTROSURGICAL) ×3 IMPLANT
GLOVE BIO SURGEON STRL SZ7.5 (GLOVE) ×3 IMPLANT
GOWN STRL REUS W/ TWL LRG LVL3 (GOWN DISPOSABLE) ×6 IMPLANT
GOWN STRL REUS W/ TWL XL LVL3 (GOWN DISPOSABLE) ×3 IMPLANT
GUIDEWIRE ENROUTE 0.014 (WIRE) ×3 IMPLANT
HEMOSTAT SNOW SURGICEL 2X4 (HEMOSTASIS) IMPLANT
KIT BASIN OR (CUSTOM PROCEDURE TRAY) ×3 IMPLANT
KIT ENCORE 26 ADVANTAGE (KITS) ×3 IMPLANT
KIT INTRODUCER GALT 7 (INTRODUCER) ×3 IMPLANT
KIT TURNOVER KIT B (KITS) ×3 IMPLANT
NDL HYPO 25GX1X1/2 BEV (NEEDLE) IMPLANT
NEEDLE HYPO 25GX1X1/2 BEV (NEEDLE) IMPLANT
PACK CAROTID (CUSTOM PROCEDURE TRAY) ×3 IMPLANT
POSITIONER HEAD DONUT 9IN (MISCELLANEOUS) ×3 IMPLANT
SET MICROPUNCTURE 5F STIFF (MISCELLANEOUS) ×3 IMPLANT
STENT TRANSCAROTID SYS 10X40 (Permanent Stent) IMPLANT
SUT MNCRL AB 4-0 PS2 18 (SUTURE) ×3 IMPLANT
SUT PROLENE 5 0 C 1 24 (SUTURE) ×3 IMPLANT
SUT SILK 2 0 PERMA HAND 18 BK (SUTURE) ×3 IMPLANT
SUT VIC AB 3-0 SH 27X BRD (SUTURE) ×3 IMPLANT
SYR 10ML LL (SYRINGE) ×9 IMPLANT
SYR 20ML LL LF (SYRINGE) ×3 IMPLANT
SYR CONTROL 10ML LL (SYRINGE) IMPLANT
SYSTEM TRANSCAROTID NEUROPRTCT (MISCELLANEOUS) ×3 IMPLANT
TOWEL GREEN STERILE (TOWEL DISPOSABLE) ×3 IMPLANT
WATER STERILE IRR 1000ML POUR (IV SOLUTION) ×3 IMPLANT
WIRE BENTSON .035X145CM (WIRE) ×3 IMPLANT

## 2024-06-29 NOTE — Progress Notes (Addendum)
  Progress Note    06/29/2024 8:08 AM * No surgery found *  Subjective:  no complaints.     Vitals:   06/28/24 2321 06/29/24 0259  BP: 137/78 (!) 107/58  Pulse: 63 62  Resp: 18 17  Temp: 98 F (36.7 C) 97.8 F (36.6 C)  SpO2: 95% 96%   Physical Exam: Lungs:  non labored Extremities:  moving all ext well Neurologic: CN grossly intact  CBC    Component Value Date/Time   WBC 5.0 06/29/2024 0534   RBC 4.00 06/29/2024 0534   HGB 12.5 06/29/2024 0534   HCT 38.1 06/29/2024 0534   PLT 182 06/29/2024 0534   MCV 95.3 06/29/2024 0534   MCH 31.3 06/29/2024 0534   MCHC 32.8 06/29/2024 0534   RDW 12.8 06/29/2024 0534   LYMPHSABS 2.6 07/05/2008 2020   MONOABS 0.5 07/05/2008 2020   EOSABS 0.2 07/05/2008 2020   BASOSABS 0.1 07/05/2008 2020    BMET    Component Value Date/Time   NA 138 06/28/2024 0410   K 4.3 06/28/2024 0410   CL 102 06/28/2024 0410   CO2 29 06/28/2024 0410   GLUCOSE 107 (H) 06/28/2024 0410   BUN 11 06/28/2024 0410   CREATININE 0.83 06/28/2024 0410   CALCIUM  9.2 06/28/2024 0410   GFRNONAA >60 06/28/2024 0410   GFRAA >60 12/03/2016 1405    INR    Component Value Date/Time   INR 1.54 07/19/2016 0417     Intake/Output Summary (Last 24 hours) at 06/29/2024 9191 Last data filed at 06/29/2024 0101 Gross per 24 hour  Intake 874.62 ml  Output 300 ml  Net 574.62 ml     Assessment/Plan:  68 y.o. female with symptomatic L ICA stenosis  Plan is for L TCAR with Dr. Gretta today 06/29/24 in OR All questions regarding surgery were answered Continue aspirin , plavix , statin Heparin  can be held on call to OR Continue npo Consent ordered   Donnice Sender, PA-C [facility] [phone] 06/29/2024 8:08 AM  I have seen and evaluated the patient. I agree with the PA note as documented above.  Plan left TCAR for symptomatic high-grade left ICA stenosis with TIA.  [facility] from [facility] with CTA neck.  Discussed risk and benefits  including 1% stroke risk.  Lonni DOROTHA Gretta, MD [facility] [place] Office: [phone]

## 2024-06-29 NOTE — Progress Notes (Addendum)
 Pharmacy Consult for Heparin   Indication: Critical carotid artery stenosis w/o infarct   Allergies  Allergen Reactions   Morphine And Codeine Hives   Doxycycline Other (See Comments)    unknown    Patient Measurements: Height: 63 inches Actual body weight:  86 kg Ideal body weight:  52 kg  Heparin  dosing weight: 71.7 kg     Vital Signs: Temp: 98.4 F (36.9 C) (07/07 0821) Temp Source: Oral (07/07 0821) BP: 115/71 (07/07 0821) Pulse Rate: 63 (07/07 0821)  Labs: Recent Labs    06/27/24 1819 06/28/24 0410 06/28/24 1841 06/28/24 1958 06/29/24 0414 06/29/24 0534 06/29/24 0655  HGB 14.0 13.7  --   --   --  12.5  --   HCT 41.2 41.4  --   --   --  38.1  --   PLT 201 202  --   --   --  182  --   APTT 40*  --  46* 36 >200*  --  56*  HEPARINUNFRC >1.10*  --  1.10*  --   --   --   --   CREATININE 0.75 0.83  --   --   --   --   --     CrCl cannot be calculated (Unknown ideal weight.).  Assessment: Laura Hodges a 69 y.o. female presented with critical carotid artery stenosis. Pt with PMH s/f Factor V Leiden, AF and h/o DVT/PE with IVC filter in place. Previously on [facility], but not for a couple of years. On admission it was recommended by Teleneuro to start Xarelto - she received 1 dose of 20 mg at [place]- 7/5 0946 (confirmed with pt). Pharmacy has been consulted for heparin  dosing.   Using aPTT to monitor Heparin  dosing currently due to recent DOAC use.  Per neurology review of MRI patient has multiple small infarcts , thus adjusted goal aPTT and heparin  level accordingly. Planning on TCAR 7/7.   7/7 ~ 4AM aPTT initially drawn above the heparin  drip, thus falsely elevated >200. Pharmacist ordered a repeat aPTT STAT to have it drawn opposite extremity of heparin  gtt.  Repeat aPTT = 56 sec, on Heparin  1150 units/hr.   Hgb 12.5, pltc 182k.  No signs of bleeding or issues with the heparin  infusion reported. We will increase heparin  rate for goal aPTT 65-85 seconds.  No  bolus.    Goal of Therapy:  HL 0.3-0.5 aPTT 66-85 Monitor platelets by anticoagulation protocol: Yes   Plan:  Increase heparin  to 1250 units/hour  (no bolus) aPTT in 6 hours  CBC/anti-Xa/aPTT daily until anti-Xa and aPTT are correlating.  Planning on TCAR 7/7.   Thank you for allowing pharmacy to be part of this patients care team.  Levorn Gaskins, RPh Clinical Pharmacist 06/29/2024 8:23 AM

## 2024-06-29 NOTE — Anesthesia Postprocedure Evaluation (Signed)
 Anesthesia Post Note  Patient: Laura Hodges  Procedure(s) Performed: LERETHA ARTERY REVASCULARIZATION (TCAR) (Left) ULTRASOUND GUIDANCE, FOR VASCULAR ACCESS (Right: Groin)     Patient location during evaluation: PACU Anesthesia Type: General Level of consciousness: awake and alert Pain management: pain level controlled Vital Signs Assessment: post-procedure vital signs reviewed and stable Respiratory status: spontaneous breathing, nonlabored ventilation, respiratory function stable and patient connected to nasal cannula oxygen Cardiovascular status: blood pressure returned to baseline and stable Postop Assessment: no apparent nausea or vomiting Anesthetic complications: no   No notable events documented.  Last Vitals:  Vitals:   06/29/24 1500 06/29/24 1530  BP: (!) 84/58 (!) 94/58  Pulse: 71 69  Resp: 14 12  Temp:    SpO2: 95% 92%    Last Pain:  Vitals:   06/29/24 1530  TempSrc:   PainSc: 6                  Shaylon Aden,W. EDMOND

## 2024-06-29 NOTE — Plan of Care (Signed)
   Problem: Clinical Measurements: Goal: Will remain free from infection Outcome: Progressing Goal: Diagnostic test results will improve Outcome: Progressing

## 2024-06-29 NOTE — Transfer of Care (Signed)
 Immediate Anesthesia Transfer of Care Note  Patient: Laura Hodges  Procedure(s) Performed: LERETHA ARTERY REVASCULARIZATION (TCAR) (Left) ULTRASOUND GUIDANCE, FOR VASCULAR ACCESS (Right: Groin)  Patient Location: PACU  Anesthesia Type:General  Level of Consciousness: awake, alert , and oriented  Airway & Oxygen Therapy: Patient Spontanous Breathing and Patient connected to face mask oxygen  Post-op Assessment: Report given to RN and Post -op Vital signs reviewed and stable  Post vital signs: Reviewed and stable  Last Vitals:  Vitals Value Taken Time  BP 124/82   Temp 98   Pulse 73 06/29/24 14:39  Resp 16 06/29/24 14:39  SpO2 96 % 06/29/24 14:39  Vitals shown include unfiled device data.  Last Pain:  Vitals:   06/29/24 1100  TempSrc: (P) Oral  PainSc:       Patients Stated Pain Goal: 0 (06/28/24 1709)  Complications: No notable events documented.

## 2024-06-29 NOTE — Anesthesia Procedure Notes (Signed)
 Arterial Line Insertion Start/End7/06/2024 12:10 PM, 06/29/2024 12:15 PM Performed by: Peggye Delon Brunswick, MD, anesthesiologist  Patient location: Pre-op. Preanesthetic checklist: patient identified, IV checked, site marked, risks and benefits discussed, surgical consent, monitors and equipment checked, pre-op evaluation, timeout performed and anesthesia consent Lidocaine  1% used for infiltration Left, radial was placed Catheter size: 20 G Hand hygiene performed  and maximum sterile barriers used   Attempts: 1 (Multiple attempts by 2 CRNAs on right radial. 1 attempt by MDA on left radial.) Procedure performed using ultrasound guided (No image saved.) technique. Ultrasound Notes:anatomy identified, needle tip was noted to be adjacent to the nerve/plexus identified and no ultrasound evidence of intravascular and/or intraneural injection Following insertion, dressing applied and Biopatch. Post procedure assessment: normal and unchanged  Patient tolerated the procedure well with no immediate complications.

## 2024-06-29 NOTE — Op Note (Addendum)
 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  Ultrasound-guided access left common femoral vein for delivery of TCAR venous sheath 2.  Transcatheter placement of left cervical carotid artery stent with angioplasty using flow reversal for distal embolic protection (left TCAR)  Surgeon: Dr. Lonni DOROTHA Gaskins, MD  Assistant: Ahmed Holster, PA  Indications: 68 year old female who presented as a transfer from [facility] with slurred speech and concern for neurologic event.  Further workup revealed a high-grade left internal carotid artery stenosis with near string sign.  The MRI did not show any acute event and this was felt to be TIA.  I recommended left carotid revascularization we discussed options of carotid endarterectomy versus TCAR and ultimately she elected for TCAR.  She presents today after risks-benefits discussed.  An assistant was needed given the complexity of the case and also for cutdown on the carotid and stent placement.  Findings: Ultrasound-guided access left common femoral vein for delivery of TCAR venous sheath.  Transverse incision above the left clavicle dissected out the common carotid artery that was then accessed percutaneously and once we had our working sheath we went on passive flow reversal and identified the high-grade left internal carotid artery stenosis with string sign.  Once we went on active flow reversal the lesion was crossed antegrade in the left ICA and then angioplasty was performed and 6 mm x 35 mm angioplasty balloon and this was stented with a 10 mm x 40 mm Enroute stent.  We allowed 3 minutes of flow reversal given the severity of her plaque.  Widely patent stent at completion with filling of the vessel distally.  Anesthesia: General  Details: Patient was taken to the operating room after informed consent was obtained.  Placed on  the operative table in supine position.  General endotracheal anesthesia was induced.  We went ahead and tucked both arms.  We placed a bump under her shoulder and turned in the neck to the right.  I went ahead and marked the common carotid artery above the left clavicle.  Bilateral groins and the left neck were then prepped and draped in standard sterile fashion.  Antibiotics were given and timeout performed.  Initially evaluated the right common femoral vein with ultrasound and I tried to access this with a micro access needle but was quite deep.  Ultimately elected to access the left groin after evaluation of this with ultrasound it was patent and compressible.  This was accessed with micro access needle placed a microwire and a micro sheath and then used a Bentson wire and placed the TCAR venous sheath in the left groin and this flushed and aspirated easily in the common femoral vein.  We then turned our attention to the left neck where a transverse incision was made 1 fingerbreadth above collarbone dissected through the platysma with Bovie cautery.  The sternocleidomastoid heads were divided in the avascular plane.  I mobilized the internal jugular vein laterally and protected the vagus nerve.  The common carotid artery was dissected out controlled umbilical tape and a large vessel loop.  Patient was given 100 units/kg IV heparin .  ACT was checked to maintain greater than 250.  I placed a pursestring on the anterior wall the common carotid with a 5-0 Prolene.  This was accessed with a micro access needle and placed a microwire and micro sheath.  I then got a left carotid angiogram identified the  carotid bifurcation.  A J-wire was advanced and then we upsized our working arterial sheath in the left common carotid artery.  The sheath was then secured with multiple 3-0 nylons.  We then went on passive flow reversal and connected the filter to the sheath in the groin.  I then got imaging of the carotid bifurcation  and identified the lesion with high-grade stenosis string sign in the proximal left internal carotid artery.  We then performed a TCAR timeout.  The common carotid was clamped and we confirmed we are on active flow reversal.  The lesion was then crossed with our wire in the internal carotid artery and then balloon angioplasty was performed with a 6 mm x 35 mm angioplasty balloon to nominal pressure for 2 minutes.  I then elected to stent this with a 10 mm x 40 mm Enroute stent deployed in the internal carotid artery into the distal common carotid artery with the help of my assistant.  We allowed 3 minutes of flow reversal.  Final imaging showed widely patent stent with no residual stenosis and looked at this in multiple views.  Our wires were removed.  Came off active clamp.  The blood give the blood back in the venous sheath disconnected the filter.  The arterial sheath in the neck was removed tying down the pursestring with good hemostasis.  We listen with pencil Doppler and had good flow in the left common carotid artery.  Protamine  was given for reversal.  The neck was irrigated out and we used Surgicel snow.  The platysma was closed with 3-0 Vicryl and skin is closed with 4-0 Monocryl and Dermabond.  The sheath in the groin was removed and manual pressure was held for 5 minutes.  She was awake and neurologically intact taken to the recovery room.  Complication: None  Condition: Stable  Lonni DOROTHA Gaskins, MD [facility] [place] Office: [phone]   Lonni JINNY Gaskins

## 2024-06-29 NOTE — Progress Notes (Signed)
  Progress Note    06/29/2024 4:48 PM * Day of Surgery *  Subjective:  pain from left neck incision   Vitals:   06/29/24 1600 06/29/24 1628  BP: 100/75 100/70  Pulse: 73 71  Resp: 13 14  Temp:    SpO2: 95% 94%   Physical Exam: Lungs:  non labored Incisions:  L neck incision c/d/I without hematoma Extremities:  moving all ext well Neurologic: CN grossly intact  CBC    Component Value Date/Time   WBC 5.0 06/29/2024 0534   RBC 4.00 06/29/2024 0534   HGB 12.5 06/29/2024 0534   HCT 38.1 06/29/2024 0534   PLT 182 06/29/2024 0534   MCV 95.3 06/29/2024 0534   MCH 31.3 06/29/2024 0534   MCHC 32.8 06/29/2024 0534   RDW 12.8 06/29/2024 0534   LYMPHSABS 2.6 07/05/2008 2020   MONOABS 0.5 07/05/2008 2020   EOSABS 0.2 07/05/2008 2020   BASOSABS 0.1 07/05/2008 2020    BMET    Component Value Date/Time   NA 138 06/28/2024 0410   K 4.3 06/28/2024 0410   CL 102 06/28/2024 0410   CO2 29 06/28/2024 0410   GLUCOSE 107 (H) 06/28/2024 0410   BUN 11 06/28/2024 0410   CREATININE 0.83 06/28/2024 0410   CALCIUM  9.2 06/28/2024 0410   GFRNONAA >60 06/28/2024 0410   GFRAA >60 12/03/2016 1405    INR    Component Value Date/Time   INR 1.54 07/19/2016 0417     Intake/Output Summary (Last 24 hours) at 06/29/2024 1648 Last data filed at 06/29/2024 1428 Gross per 24 hour  Intake 1254.62 ml  Output 10 ml  Net 1244.62 ml     Assessment/Plan:  68 y.o. female is s/p L TCAR  L neck incision is well appearing without hematoma Neuro exam is at baseline; moving all ext well Hold heparin ; plan will be aspirin , plavix , Xarelto  at discharge if no signs of bleeding Ok for discharge tomorrow if no events overnight; follow up will be with duplex in 1 month   Donnice Sender, PA-C [facility] [phone] 06/29/2024 4:48 PM

## 2024-06-29 NOTE — Anesthesia Preprocedure Evaluation (Addendum)
 Anesthesia Evaluation  Patient identified by MRN, date of birth, ID band Patient awake    Reviewed: Allergy & Precautions, H&P , NPO status , Patient's Chart, lab work & pertinent test results  Airway Mallampati: II  TM Distance: >3 FB Neck ROM: Full    Dental no notable dental hx. (+) Edentulous Upper, Partial Lower, Dental Advisory Given   Pulmonary Current Smoker and Patient abstained from smoking.   Pulmonary exam normal breath sounds clear to auscultation       Cardiovascular hypertension, + CAD, + Cardiac Stents and + Peripheral Vascular Disease   Rhythm:Regular Rate:Normal     Neuro/Psych   Anxiety Depression    TIA   GI/Hepatic Neg liver ROS,GERD  Medicated,,  Endo/Other  diabetes, Type 2, Oral Hypoglycemic Agents    Renal/GU negative Renal ROS  negative genitourinary   Musculoskeletal  (+) Arthritis , Osteoarthritis,    Abdominal   Peds  Hematology negative hematology ROS (+)   Anesthesia Other Findings   Reproductive/Obstetrics negative OB ROS                              Anesthesia Physical Anesthesia Plan  ASA: 3  Anesthesia Plan: General   Post-op Pain Management: Tylenol  PO (pre-op)*   Induction: Intravenous  PONV Risk Score and Plan: 3 and Ondansetron , Dexamethasone  and Treatment may vary due to age or medical condition  Airway Management Planned: Oral ETT  Additional Equipment: Arterial line  Intra-op Plan:   Post-operative Plan: Extubation in OR  Informed Consent: I have reviewed the patients History and Physical, chart, labs and discussed the procedure including the risks, benefits and alternatives for the proposed anesthesia with the patient or authorized representative who has indicated his/her understanding and acceptance.     Dental advisory given  Plan Discussed with: CRNA  Anesthesia Plan Comments:          Anesthesia Quick Evaluation

## 2024-06-29 NOTE — Anesthesia Procedure Notes (Signed)
 Procedure Name: Intubation Date/Time: 06/29/2024 1:00 PM  Performed by: Zelphia Norleen HERO, CRNAPre-anesthesia Checklist: Patient identified, Emergency Drugs available, Suction available and Patient being monitored Patient Re-evaluated:Patient Re-evaluated prior to induction Oxygen Delivery Method: Circle system utilized Preoxygenation: Pre-oxygenation with 100% oxygen Induction Type: IV induction Ventilation: Mask ventilation without difficulty Laryngoscope Size: Mac and 3 Grade View: Grade I Tube type: Oral Tube size: 7.0 mm Number of attempts: 1 Airway Equipment and Method: Stylet and Oral airway Placement Confirmation: ETT inserted through vocal cords under direct vision, positive ETCO2 and breath sounds checked- equal and bilateral Secured at: 23 cm Tube secured with: Tape Dental Injury: Teeth and Oropharynx as per pre-operative assessment

## 2024-06-29 NOTE — Progress Notes (Addendum)
 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  CT head revealed no acute abnormality.  CTa head and neck revealed chronic appearing near complete occlusion of the proximal left ICA.  MRI revealed no acute CVA but again confirmed critical stenosis of the left carotid artery with potential evidence of a clot and string sign.  She was transferred to [facility] for Vasc Surgery evaluation.  Goals of Care:   Code Status: Full Code   DVT prophylaxis: IV heparin   Interim Hx: No acute events reported overnight.  Afebrile.  Vital signs stable.  Patient has been off the floor on multiple attempts to visit her due to her vascular surgery today.  She has been under the expert care of the Vascular Surgery Team.  Assessment & Plan:  Symptomatic critical left ICA stenosis TCAR 7/7 per VVS - post-op care per [facility]  Acute Strokes -Neurology notes on MRI left MCA and MCA/ACA 3-4 small infarcts likely secondary to left ICA near occlusion though these findings are not mentioned in he official radiology report  -Presented initially with right facial droop, right facial numbness, and slurring of speech -MRI brain w/o evidence of CVA on official report but noted critical LCA stenosis - Neurology feels MRI DOES show small acute CVAs -CTa head and neck noted short segment chronic appearing near complete occlusion of the proximal left ICA -TTE notes EF 60-65% with no WMA and normal diastolic function -medical treatment: Aspirin  plus Plavix  -LDL 62 -A1c 5.0 -PT/OT/SLP -Allow for permissive hypertension  HTN Blood pressure within goal range  at present  HLD Continue usual home medical therapy with LDL 62  Chronic atrial fibrillation Has not been on anticoagulation for approximately 4 years  Factor V Leiden mutation with history of DVT and PE Has IVC filter -has not been on anticoagulation for approximately 4 years, initially due to financial constraints, and then was told by her PCP that she did not need it anymore per her history  Tobacco abuse Actively smoking approximately 1 pack/day  Chronic pain Continue usual oxycodone  dose  Depression/anxiety Continue usual home medications  RLS Continue usual home ropinirole  dose   Family Communication:  Disposition: Will depend upon performance with therapy after surgery -suspect she will be discharged home   Objective: Blood pressure 115/71, pulse 63, temperature 98.4 F (36.9 C), temperature source Oral, resp. rate 13, height 5' 3 (1.6 m), weight 86 kg, SpO2 93%.  Intake/Output Summary (Last 24 hours) at 06/29/2024 0928 Last data filed at 06/29/2024 0101 Gross per 24 hour  Intake 634.62 ml  Output --  Net 634.62 ml   Filed Weights   06/29/24 0844  Weight: 86 kg    Examination: Patient unavailable for exam   CBC: Recent Labs  Lab 06/27/24 1819 06/28/24 0410 06/29/24 0534  WBC 5.6 5.9 5.0  HGB 14.0 13.7 12.5  HCT 41.2 41.4 38.1  MCV 94.3 93.9 95.3  PLT 201 202 182   Basic Metabolic Panel: Recent Labs  Lab 06/27/24 1819 06/28/24 0410  NA 139 138  K 4.0 4.3  CL 104 102  CO2 26 29  GLUCOSE 103* 107*  BUN 12 11  CREATININE 0.75 0.83  CALCIUM  9.2 9.2   GFR: Estimated Creatinine Clearance: 66.4 mL/min (by C-G formula based on SCr of 0.83 mg/dL).   Scheduled Meds:  aspirin   81 mg Oral Daily   Chlorhexidine  Gluconate Cloth  6 each Topical Q0600   clopidogrel   75 mg Oral Daily   levothyroxine   88 mcg Oral QAC breakfast   linaclotide   145 mcg Oral q morning   nicotine   21 mg Transdermal Daily   pantoprazole   40 mg Oral Daily   pregabalin    225 mg Oral TID   rOPINIRole   1 mg Oral TID   rosuvastatin   40 mg Oral Daily   sodium chloride  flush  3 mL Intravenous Q12H   zolpidem   5 mg Oral Daily   Continuous Infusions:  heparin  1,250 Units/hr (06/29/24 0903)     LOS: 2 days   Reyes IVAR Moores, MD [facility] Office  [phone] Pager - Text Page per Tracey  If 7PM-7AM, please contact night-coverage per Amion 06/29/2024, 9:28 AM

## 2024-06-30 ENCOUNTER — Encounter (HOSPITAL_COMMUNITY): Payer: Self-pay | Admitting: Vascular Surgery

## 2024-06-30 ENCOUNTER — Other Ambulatory Visit (HOSPITAL_COMMUNITY): Payer: Self-pay

## 2024-06-30 DIAGNOSIS — I6522 Occlusion and stenosis of left carotid artery: Secondary | ICD-10-CM | POA: Diagnosis not present

## 2024-06-30 DIAGNOSIS — E785 Hyperlipidemia, unspecified: Secondary | ICD-10-CM | POA: Diagnosis not present

## 2024-06-30 DIAGNOSIS — I63512 Cerebral infarction due to unspecified occlusion or stenosis of left middle cerebral artery: Secondary | ICD-10-CM | POA: Diagnosis not present

## 2024-06-30 DIAGNOSIS — I63522 Cerebral infarction due to unspecified occlusion or stenosis of left anterior cerebral artery: Secondary | ICD-10-CM | POA: Diagnosis not present

## 2024-06-30 DIAGNOSIS — Z48812 Encounter for surgical aftercare following surgery on the circulatory system: Secondary | ICD-10-CM

## 2024-06-30 LAB — BASIC METABOLIC PANEL WITH GFR
Anion gap: 8 (ref 5–15)
BUN: 12 mg/dL (ref 8–23)
CO2: 24 mmol/L (ref 22–32)
Calcium: 9 mg/dL (ref 8.9–10.3)
Chloride: 107 mmol/L (ref 98–111)
Creatinine, Ser: 0.78 mg/dL (ref 0.44–1.00)
GFR, Estimated: >60 mL/min (ref >60)
Glucose, Bld: 189 mg/dL — ABNORMAL HIGH (ref 70–99)
Potassium: 4.2 mmol/L (ref 3.5–5.1)
Sodium: 139 mmol/L (ref 135–145)

## 2024-06-30 LAB — CBC
HCT: 34.6 % — ABNORMAL LOW (ref 36.0–46.0)
Hemoglobin: 11.7 g/dL — ABNORMAL LOW (ref 12.0–15.0)
MCH: 31.7 pg (ref 26.0–34.0)
MCHC: 33.8 g/dL (ref 30.0–36.0)
MCV: 93.8 fL (ref 80.0–100.0)
Platelets: 186 K/uL (ref 150–400)
RBC: 3.69 MIL/uL — ABNORMAL LOW (ref 3.87–5.11)
RDW: 12.4 % (ref 11.5–15.5)
WBC: 10.4 K/uL (ref 4.0–10.5)
nRBC: 0 % (ref 0.0–0.2)

## 2024-06-30 LAB — GLUCOSE, CAPILLARY: Glucose-Capillary: 188 mg/dL — ABNORMAL HIGH (ref 70–99)

## 2024-06-30 MED ORDER — RIVAROXABAN 20 MG PO TABS
20.0000 mg | ORAL_TABLET | Freq: Every day | ORAL | Status: DC
  Administered 2024-06-30: 20 mg via ORAL
  Filled 2024-06-30: qty 1

## 2024-06-30 MED ORDER — ASPIRIN 81 MG PO CHEW
81.0000 mg | CHEWABLE_TABLET | Freq: Every day | ORAL | 2 refills | Status: AC
  Filled 2024-06-30: qty 90, 90d supply, fill #0

## 2024-06-30 MED ORDER — PANTOPRAZOLE SODIUM 40 MG PO TBEC
40.0000 mg | DELAYED_RELEASE_TABLET | Freq: Every day | ORAL | 0 refills | Status: AC
  Filled 2024-06-30: qty 30, 30d supply, fill #0

## 2024-06-30 MED ORDER — CLOPIDOGREL BISULFATE 75 MG PO TABS
75.0000 mg | ORAL_TABLET | Freq: Every day | ORAL | 0 refills | Status: AC
  Filled 2024-06-30: qty 30, 30d supply, fill #0

## 2024-06-30 MED ORDER — OMEPRAZOLE 40 MG PO CPDR: 40.0000 mg | DELAYED_RELEASE_CAPSULE | Freq: Two times a day (BID) | ORAL | Status: AC

## 2024-06-30 MED ORDER — RIVAROXABAN 20 MG PO TABS
20.0000 mg | ORAL_TABLET | Freq: Every day | ORAL | 2 refills | Status: AC
  Filled 2024-06-30: qty 90, 90d supply, fill #0

## 2024-06-30 MED ORDER — OXYCODONE HCL 10 MG PO TABS
10.0000 mg | ORAL_TABLET | Freq: Four times a day (QID) | ORAL | 0 refills | Status: DC | PRN
  Filled 2024-06-30: qty 15, 4d supply, fill #0

## 2024-06-30 MED ORDER — ROSUVASTATIN CALCIUM 40 MG PO TABS
40.0000 mg | ORAL_TABLET | Freq: Every day | ORAL | 2 refills | Status: AC
  Filled 2024-06-30: qty 30, 30d supply, fill #0

## 2024-06-30 NOTE — Discharge Instructions (Signed)

## 2024-06-30 NOTE — Progress Notes (Addendum)
  Progress Note    06/30/2024 8:40 AM 1 Day Post-Op  Subjective: She says she feels okay.  She has a mild frontal headache that has improved with Tylenol    Vitals:   06/30/24 0000 06/30/24 0727  BP: 106/64 128/82  Pulse: 69 76  Resp: 12 20  Temp: 98 F (36.7 C) 97.8 F (36.6 C)  SpO2: 94% 95%    Physical Exam: General: Sitting up in bed, NAD Lungs: Nonlabored Incisions: Left-sided neck incision intact and dry without hematoma.  Mild, soft bruising Extremities: Moving all extremities equally Neuro: No slurred speech, no tongue deviation  CBC    Component Value Date/Time   WBC 10.4 06/30/2024 0500   RBC 3.69 (L) 06/30/2024 0500   HGB 11.7 (L) 06/30/2024 0500   HCT 34.6 (L) 06/30/2024 0500   PLT 186 06/30/2024 0500   MCV 93.8 06/30/2024 0500   MCH 31.7 06/30/2024 0500   MCHC 33.8 06/30/2024 0500   RDW 12.4 06/30/2024 0500   LYMPHSABS 2.6 07/05/2008 2020   MONOABS 0.5 07/05/2008 2020   EOSABS 0.2 07/05/2008 2020   BASOSABS 0.1 07/05/2008 2020    BMET    Component Value Date/Time   NA 139 06/30/2024 0500   K 4.2 06/30/2024 0500   CL 107 06/30/2024 0500   CO2 24 06/30/2024 0500   GLUCOSE 189 (H) 06/30/2024 0500   BUN 12 06/30/2024 0500   CREATININE 0.78 06/30/2024 0500   CALCIUM  9.0 06/30/2024 0500   GFRNONAA >60 06/30/2024 0500   GFRAA >60 12/03/2016 1405    INR    Component Value Date/Time   INR 1.54 07/19/2016 0417     Intake/Output Summary (Last 24 hours) at 06/30/2024 0840 Last data filed at 06/29/2024 1800 Gross per 24 hour  Intake 1100 ml  Output 210 ml  Net 890 ml      Assessment/Plan:  68 y.o. female is 1 day postop, s/p: Left TCAR   - She is doing fairly well this morning.  She reports a mild frontal headache that started this morning, but it has improved with Tylenol  -Left-sided neck incision dry and intact without hematoma -She remains neurologically intact since surgery.  She denies any slurred speech, facial droop, sudden  weakness/numbness, or sudden visual changes -Hemoglobin stable at 11.7 without further signs of blood loss -She is tolerating a normal diet without any swallowing difficulty -Continue aspirin , Plavix , and statin.  She can restart her Xarelto  today for A-fib.  As long as she has no bleeding issues, she is stable for discharge home today from vascular perspective -She will be on triple therapy for 3 months.  Her Plavix  can be discontinued after 1 month. -Will arrange follow-up with our office in 4 weeks with carotid duplex   Ahmed Holster, PA-C [facility] [phone] 06/30/2024 8:40 AM   I have seen and evaluated the patient. I agree with the PA note as documented above.  Postop day 1 status post left TCAR for high-grade symptomatic stenosis.  Looks good this morning and neuro intact.  Okay for discharge from my standpoint.  Will need aspirin  Plavix  statin at discharge for the carotid stent.  Will be on triple therapy with a DOAC for A-fib for at least 1 month.  Discussed will arrange follow-up in 1 month in the office for carotid duplex. If stent looks good at that point can stop Plavix .  Lonni DOROTHA Gaskins, MD [facility] [place] Office: [phone]

## 2024-06-30 NOTE — Evaluation (Signed)
 Physical Therapy Evaluation Patient Details Name: Laura Hodges MRN: [id number] DOB: 1955-12-25 Today's Date: 06/30/2024  History of Present Illness  Laura Hodges is a 68 y.o. female admitted 06/27/24 from [facility] for vascular surgery evaluation of L ICA string sign/critical stenosis. TCAR 7/7. PMHx: HTN, HLD, A-fib not on AC, CAD, GERD, factor V Leiden, DVT/PE, and COPD.  Clinical Impression  Pt admitted with above diagnosis. Pt with good balance overall. STates she will use cane prn.  No F/u anticipated.  Will follow acutely. Pt currently with functional limitations due to the deficits listed below (see PT Problem List). Pt will benefit from acute skilled PT to increase their independence and safety with mobility to allow discharge.           If plan is discharge home, recommend the following: Assist for transportation   Can travel by private vehicle        Equipment Recommendations None recommended by PT  Recommendations for Other Services       Functional Status Assessment Patient has had a recent decline in their functional status and demonstrates the ability to make significant improvements in function in a reasonable and predictable amount of time.     Precautions / Restrictions Precautions Precautions: Fall Recall of Precautions/Restrictions: Intact Restrictions Weight Bearing Restrictions Per Provider Order: No      Mobility  Bed Mobility Overal bed mobility: Modified Independent                  Transfers Overall transfer level: Needs assistance   Transfers: Sit to/from Stand, Bed to chair/wheelchair/BSC Sit to Stand: Supervision   Step pivot transfers: Supervision       General transfer comment: Supervision for safety with no physical assist needed and no cues needed for safety    Ambulation/Gait Ambulation/Gait assistance: Supervision Gait Distance (Feet): 50 Feet Assistive device: None Gait Pattern/deviations: Step-through pattern,  Decreased stride length   Gait velocity interpretation: <1.31 ft/sec, indicative of household ambulator   General Gait Details: No LOB without device.  Pt with good safety awareness.  Stairs            Wheelchair Mobility     Tilt Bed    Modified Rankin (Stroke Patients Only)       Balance Overall balance assessment: Needs assistance Sitting-balance support: No upper extremity supported, Feet supported Sitting balance-Leahy Scale: Good     Standing balance support: No upper extremity supported, During functional activity Standing balance-Leahy Scale: Good                               Pertinent Vitals/Pain Pain Assessment Pain Assessment: No/denies pain Faces Pain Scale: Hurts little more (Pt reports chronic back pain and reports this is her baseline.) Pain Location: back Pain Descriptors / Indicators: Aching, Discomfort, Grimacing (in back; pt also reports conitnued numbness in R side of face present since 7/5 but states it is not painful and feels it is improving) Pain Intervention(s): Limited activity within patient's tolerance, Monitored during session, Repositioned    Home Living Family/patient expects to be discharged to:: Private residence Living Arrangements: Other relatives;Non-relatives/Friends (brother, sister, roommate) Available Help at Discharge: Family;Friend(s);Available 24 hours/day Type of Home: Mobile home Home Access: Stairs to enter Entrance Stairs-Rails: Right;Left;Can reach both Entrance Stairs-Number of Steps: 3   Home Layout: One level Home Equipment: Rollator (4 wheels);Cane - single point Additional Comments: Pt is currently visiting a close family friends  and will go back to friend's house for a few days post-discharge. Friend's house is one level with 2-3 steps to enter without hand rails and with a tub/shower combo. Friend can provide 24/7 assist.    Prior Function Prior Level of Function : Independent/Modified  Independent;Driving             Mobility Comments: Ind to Mod I with SPC. Pt reports fear of falling due to having 1 fall in shower approx 2 years ago ADLs Comments: Ind to Mod I with ADLs and IADLs; drives; enjoys working in her garden     Extremity/Trunk Assessment   Upper Extremity Assessment Upper Extremity Assessment: Defer to OT evaluation LUE Deficits / Details: overall WFL; pt reports hx of shoulder replacement approx 6 years ago    Lower Extremity Assessment Lower Extremity Assessment: Overall WFL for tasks assessed    Cervical / Trunk Assessment Cervical / Trunk Assessment: Normal  Communication   Communication Communication: No apparent difficulties    Cognition Arousal: Alert Behavior During Therapy: WFL for tasks assessed/performed   PT - Cognitive impairments: No apparent impairments                         Following commands: Intact       Cueing Cueing Techniques: Verbal cues     General Comments General comments (skin integrity, edema, etc.): 72 bpm- 135 bpm for brief time when pt crying    Exercises     Assessment/Plan    PT Assessment Patient needs continued PT services  PT Problem List Decreased activity tolerance;Decreased balance;Decreased mobility;Decreased knowledge of use of DME;Decreased safety awareness;Cardiopulmonary status limiting activity       PT Treatment Interventions DME instruction;Gait training;Functional mobility training;Therapeutic activities;Therapeutic exercise;Balance training;Patient/family education;Stair training    PT Goals (Current goals can be found in the Care Plan section)  Acute Rehab PT Goals Patient Stated Goal: to go home PT Goal Formulation: With patient Time For Goal Achievement: 07/14/24 Potential to Achieve Goals: Good    Frequency Min 2X/week     Co-evaluation               AM-PAC PT 6 Clicks Mobility  Outcome Measure Help needed turning from your back to your side  while in a flat bed without using bedrails?: None Help needed moving from lying on your back to sitting on the side of a flat bed without using bedrails?: None Help needed moving to and from a bed to a chair (including a wheelchair)?: A Little Help needed standing up from a chair using your arms (e.g., wheelchair or bedside chair)?: A Little Help needed to walk in hospital room?: A Little Help needed climbing 3-5 steps with a railing? : A Little 6 Click Score: 20    End of Session Equipment Utilized During Treatment: Gait belt Activity Tolerance: Patient limited by fatigue Patient left: in bed;with call bell/phone within reach Nurse Communication: Mobility status PT Visit Diagnosis: Muscle weakness (generalized) (M62.81)    Time: 9066-9053 PT Time Calculation (min) (ACUTE ONLY): 13 min   Charges:   PT Evaluation $PT Eval Low Complexity: 1 Low   PT General Charges $$ ACUTE PT VISIT: 1 Visit         Amilyah Nack M,PT Acute Rehab Services [phone]   Stephane JULIANNA Bevel 06/30/2024, 10:06 AM

## 2024-06-30 NOTE — Progress Notes (Signed)
 Occupational Therapy Treatment Patient Details Name: Laura Hodges MRN: [id number] DOB: 29-Apr-1956 Today's Date: 06/30/2024   History of present illness Laura Hodges is a 68 y.o. female admitted 06/27/24 from [facility] for vascular surgery evaluation of L ICA string sign/critical stenosis. TCAR 7/7. PMHx: HTN, HLD, A-fib not on AC, CAD, GERD, factor V Leiden, DVT/PE, and COPD.   OT comments  Patient completed supine to sit with mod I with HOB elevated, sit to stand and functional mobility completed to bathroom with Supervision with verbal cues for safety 2.2 patient pushing IV pole.  Patient completed toileting activity with distant supervision and UB grooming task of oral and face hygiene standing at sink with Supervision.  Patient completed UB dressing with SBA/Supervision to don clean hospital gown. Patient left sitting in chair with all needs within reach.  Patient is making good progress toward OT goals at this time and would benefit from additional OT intervention to address deficits in order for patient to safely return home to PLOF.      If plan is discharge home, recommend the following:  A little help with walking and/or transfers;A little help with bathing/dressing/bathroom;Assist for transportation;Help with stairs or ramp for entrance   Equipment Recommendations       Recommendations for Other Services      Precautions / Restrictions Precautions Precautions: Fall Recall of Precautions/Restrictions: Intact Restrictions Weight Bearing Restrictions Per Provider Order: No       Mobility Bed Mobility Overal bed mobility: Modified Independent (HOB elevated)                  Transfers Overall transfer level: Needs assistance Equipment used: None Transfers: Sit to/from Stand, Bed to chair/wheelchair/BSC Sit to Stand: Supervision                 Balance           Standing balance support: No upper extremity supported, During functional activity                                ADL either performed or assessed with clinical judgement   ADL Overall ADL's : Needs assistance/impaired Eating/Feeding: Independent;Sitting   Grooming: Supervision/safety;Standing           Upper Body Dressing : Modified independent;Sitting (to don clean gown)   Lower Body Dressing: Supervision/safety   Toilet Transfer: Supervision/safety;Cueing for safety   Toileting- Clothing Manipulation and Hygiene: Supervision/safety       Functional mobility during ADLs: Supervision/safety (holding onto IV pole as she pushed it)      Extremity/Trunk Assessment Upper Extremity Assessment Upper Extremity Assessment: Overall WFL for tasks assessed LUE Deficits / Details: overall WFL; pt reports hx of shoulder replacement approx 6 years ago   Lower Extremity Assessment Lower Extremity Assessment: Defer to PT evaluation   Cervical / Trunk Assessment Cervical / Trunk Assessment: Normal    Vision       Perception     Praxis     Communication Communication Communication: No apparent difficulties   Cognition Arousal: Alert Behavior During Therapy: WFL for tasks assessed/performed Cognition: No apparent impairments                               Following commands: Intact        Cueing   Cueing Techniques: Verbal cues  Exercises  Shoulder Instructions       General Comments 72 bpm- 135 bpm for brief time when pt crying    Pertinent Vitals/ Pain       Pain Assessment Pain Assessment: 0-10 Pain Score: 3  Pain Location: back Pain Descriptors / Indicators: Aching Pain Intervention(s): Monitored during session  Home Living Family/patient expects to be discharged to:: Private residence Living Arrangements: Other relatives;Non-relatives/Friends (brother, sister, roommate) Available Help at Discharge: Family;Friend(s);Available 24 hours/day Type of Home: Mobile home Home Access: Stairs to enter Entrance  Stairs-Number of Steps: 3 Entrance Stairs-Rails: Right;Left;Can reach both Home Layout: One level     Bathroom Shower/Tub: Chief Strategy Officer: Standard     Home Equipment: Rollator (4 wheels);Cane - single point   Additional Comments: Pt is currently visiting a close family friends and will go back to friend's house for a few days post-discharge. Friend's house is one level with 2-3 steps to enter without hand rails and with a tub/shower combo. Friend can provide 24/7 assist.      Prior Functioning/Environment              Frequency  Min 2X/week        Progress Toward Goals  OT Goals(current goals can now be found in the care plan section)  Progress towards OT goals: Progressing toward goals  Acute Rehab OT Goals OT Goal Formulation: With patient Time For Goal Achievement: 07/12/24 Potential to Achieve Goals: Good ADL Goals Pt Will Perform Grooming: with modified independence;standing Pt Will Perform Lower Body Bathing: with modified independence;sitting/lateral leans;sit to/from stand Pt Will Perform Lower Body Dressing: with modified independence;sitting/lateral leans;sit to/from stand Pt Will Transfer to Toilet: with modified independence;ambulating;regular height toilet Pt Will Perform Tub/Shower Transfer: Tub transfer;with modified independence;ambulating;shower seat Additional ADL Goal #1: Patient will demonstrate ability to Independently state 3 energy conservation strategies for increased safety and independence with functional tasks.  Plan      Co-evaluation                 AM-PAC OT 6 Clicks Daily Activity     Outcome Measure   Help from another person eating meals?: None Help from another person taking care of personal grooming?: A Little Help from another person toileting, which includes using toliet, bedpan, or urinal?: A Little Help from another person bathing (including washing, rinsing, drying)?: A Little Help from  another person to put on and taking off regular upper body clothing?: None Help from another person to put on and taking off regular lower body clothing?: A Little 6 Click Score: 20    End of Session    OT Visit Diagnosis: Unsteadiness on feet (R26.81);Other (comment)   Activity Tolerance Patient tolerated treatment well   Patient Left in chair;with call bell/phone within reach   Nurse Communication Mobility status        Time: 9162-9149 OT Time Calculation (min): 13 min  Charges: OT General Charges $OT Visit: 1 Visit OT Treatments $Self Care/Home Management : 8-22 mins  Lamarr Pouch OT/L  Lamarr JONETTA Pouch 06/30/2024, 10:59 AM

## 2024-06-30 NOTE — Progress Notes (Signed)
   06/30/24 1227  TOC Brief Assessment  Insurance and Status Reviewed  Patient has primary care physician Yes Leobardo, Pat, MD)  Home environment has been reviewed home  Prior level of function: self  Prior/Current Home Services No current home services  Social Drivers of Health Review SDOH reviewed no interventions necessary  Readmission risk has been reviewed Yes  Transition of care needs no transition of care needs at this time    Pt s/p TCAR on 7/7, stable for transition home today, no HH or DME needs noted (pt has declined shower chair at this time). Family to transport home.

## 2024-06-30 NOTE — Progress Notes (Signed)
 STROKE TEAM PROGRESS NOTE   SUBJECTIVE (INTERVAL HISTORY) No family is at the bedside. Pt lying in bed, complaining of mild HA today, bifrontal, but eager to go home today. On Xarelto  with DAPT post TCAR>    OBJECTIVE Temp:  [97.6 F (36.4 C)-98 F (36.7 C)] 97.8 F (36.6 C) (07/08 0727) Pulse Rate:  [69-82] 76 (07/08 0727) Cardiac Rhythm: Normal sinus rhythm (07/08 0734) Resp:  [11-20] 17 (07/08 0920) BP: (84-128)/(51-82) 128/82 (07/08 0727) SpO2:  [88 %-95 %] 95 % (07/08 0727) Arterial Line BP: (95-135)/(48-78) 117/66 (07/08 0000)  Recent Labs  Lab 06/28/24 2320 06/29/24 0606 06/29/24 1638 06/29/24 2143 06/30/24 0555  GLUCAP 143* 114* 117* 203* 188*   Recent Labs  Lab 06/27/24 1819 06/28/24 0410 06/30/24 0500  NA 139 138 139  K 4.0 4.3 4.2  CL 104 102 107  CO2 26 29 24   GLUCOSE 103* 107* 189*  BUN 12 11 12   CREATININE 0.75 0.83 0.78  CALCIUM  9.2 9.2 9.0   Recent Labs  Lab 06/27/24 1819 06/28/24 0410  AST 22 20  ALT 18 19  ALKPHOS 69 59  BILITOT 0.4 0.3  PROT 6.6 6.2*  ALBUMIN  3.7 3.4*   Recent Labs  Lab 06/27/24 1819 06/28/24 0410 06/29/24 0534 06/30/24 0500  WBC 5.6 5.9 5.0 10.4  HGB 14.0 13.7 12.5 11.7*  HCT 41.2 41.4 38.1 34.6*  MCV 94.3 93.9 95.3 93.8  PLT 201 202 182 186   No results for input(s): CKTOTAL, CKMB, CKMBINDEX, TROPONINI in the last 168 hours. No results for input(s): LABPROT, INR in the last 72 hours. No results for input(s): COLORURINE, LABSPEC, PHURINE, GLUCOSEU, HGBUR, BILIRUBINUR, KETONESUR, PROTEINUR, UROBILINOGEN, NITRITE, LEUKOCYTESUR in the last 72 hours.  Invalid input(s): APPERANCEUR     Component Value Date/Time   CHOL 153 06/28/2024 0410   TRIG 275 (H) 06/28/2024 0410   HDL 36 (L) 06/28/2024 0410   CHOLHDL 4.3 06/28/2024 0410   VLDL 55 (H) 06/28/2024 0410   LDLCALC 62 06/28/2024 0410   Lab Results  Component Value Date   HGBA1C 5.0 06/27/2024      Component Value  Date/Time   LABOPIA POSITIVE (A) 06/28/2024 0916   COCAINSCRNUR NONE DETECTED 06/28/2024 0916   LABBENZ NONE DETECTED 06/28/2024 0916   AMPHETMU NONE DETECTED 06/28/2024 0916   THCU NONE DETECTED 06/28/2024 0916   LABBARB NONE DETECTED 06/28/2024 0916    No results for input(s): ETH in the last 168 hours.  I have personally reviewed the radiological images below and agree with the radiology interpretations.  HYBRID OR IMAGING ([facility] ONLY) Result Date: 06/29/2024 There is no interpretation for this exam.  This order is for images obtained during a surgical procedure.  Please See Surgeries Tab for more information regarding the procedure.   ECHOCARDIOGRAM COMPLETE Result Date: 06/28/2024    ECHOCARDIOGRAM REPORT   Patient Name:   ANNEBELLE BOSTIC Date of Exam: 06/28/2024 Medical Rec #:  [id number]     Height:       63.0 in Accession #:    [id number]    Weight:       205.0 lb Date of Birth:  06/29/1956     BSA:          1.954 m Patient Age:    68 years      BP:           134/62 mmHg Patient Gender: F             HR:  62 bpm. Exam Location:  Inpatient Procedure: 2D Echo, Color Doppler and Cardiac Doppler (Both Spectral and Color            Flow Doppler were utilized during procedure). Indications:    TIA G45.9  History:        Patient has prior history of Echocardiogram examinations, most                 recent 08/16/2021. CAD, COPD, Arrythmias:Atrial Fibrillation;                 Risk Factors:Hypertension and Dyslipidemia.  Sonographer:    Damien Senior RDCS Referring Phys: [id number] MARSA NOVAK MELVIN  Sonographer Comments: Technically difficult due to lung interference IMPRESSIONS  1. Left ventricular ejection fraction, by estimation, is 60 to 65%. The left ventricle has normal function. The left ventricle has no regional wall motion abnormalities. Left ventricular diastolic parameters were normal.  2. Right ventricular systolic function is normal. The right ventricular size is normal. Tricuspid  regurgitation signal is inadequate for assessing PA pressure.  3. The mitral valve is degenerative. Trivial mitral valve regurgitation.  4. The aortic valve was not well visualized. Aortic valve regurgitation is not visualized.  5. The inferior vena cava is normal in size with greater than 50% respiratory variability, suggesting right atrial pressure of 3 mmHg. Comparison(s): No prior Echocardiogram. FINDINGS  Left Ventricle: Left ventricular ejection fraction, by estimation, is 60 to 65%. The left ventricle has normal function. The left ventricle has no regional wall motion abnormalities. The left ventricular internal cavity size was normal in size. Suboptimal image quality limits for assessment of left ventricular hypertrophy. Left ventricular diastolic parameters were normal. Right Ventricle: The right ventricular size is normal. No increase in right ventricular wall thickness. Right ventricular systolic function is normal. Tricuspid regurgitation signal is inadequate for assessing PA pressure. Left Atrium: Left atrial size was normal in size. Right Atrium: Right atrial size was normal in size. Pericardium: There is no evidence of pericardial effusion. Presence of epicardial fat layer. Mitral Valve: The mitral valve is degenerative in appearance. There is mild calcification of the mitral valve leaflet(s). Mild mitral annular calcification. Trivial mitral valve regurgitation. Tricuspid Valve: The tricuspid valve is normal in structure. Tricuspid valve regurgitation is trivial. Aortic Valve: The aortic valve was not well visualized. Aortic valve regurgitation is not visualized. Pulmonic Valve: The pulmonic valve was grossly normal. Pulmonic valve regurgitation is not visualized. Aorta: The aortic root is normal in size and structure and the ascending aorta was not well visualized. Venous: The inferior vena cava is normal in size with greater than 50% respiratory variability, suggesting right atrial pressure of 3  mmHg. IAS/Shunts: The interatrial septum appears to be lipomatous. No atrial level shunt detected by color flow Doppler. Additional Comments: 3D was performed not requiring image post processing on an independent workstation and was indeterminate.  LEFT VENTRICLE PLAX 2D LVOT diam:     2.00 cm   Diastology LV SV:         72        LV e' medial:    8.81 cm/s LV SV Index:   37        LV E/e' medial:  8.4 LVOT Area:     3.14 cm  LV e' lateral:   9.25 cm/s                          LV E/e' lateral: 8.0  RIGHT VENTRICLE RV S prime:     12.60 cm/s TAPSE (M-mode): 2.4 cm LEFT ATRIUM             Index        RIGHT ATRIUM           Index LA Vol (A2C):   46.5 ml 23.80 ml/m  RA Area:     13.70 cm LA Vol (A4C):   47.3 ml 24.21 ml/m  RA Volume:   31.30 ml  16.02 ml/m LA Biplane Vol: 48.0 ml 24.56 ml/m  AORTIC VALVE LVOT Vmax:   106.00 cm/s LVOT Vmean:  68.300 cm/s LVOT VTI:    0.230 m  AORTA Ao Root diam: 3.20 cm MITRAL VALVE MV Area (PHT): 2.56 cm    SHUNTS MV Decel Time: 296 msec    Systemic VTI:  0.23 m MV E velocity: 73.90 cm/s  Systemic Diam: 2.00 cm MV A velocity: 57.60 cm/s MV E/A ratio:  1.28 Jayson Sierras MD Electronically signed by Jayson Sierras MD Signature Date/Time: 06/28/2024/12:02:04 PM    Final      PHYSICAL EXAM  Temp:  [97.6 F (36.4 C)-98 F (36.7 C)] 97.8 F (36.6 C) (07/08 0727) Pulse Rate:  [69-82] 76 (07/08 0727) Resp:  [11-20] 17 (07/08 0920) BP: (84-128)/(51-82) 128/82 (07/08 0727) SpO2:  [88 %-95 %] 95 % (07/08 0727) Arterial Line BP: (95-135)/(48-78) 117/66 (07/08 0000)  General - Well nourished, well developed, in no apparent distress.  Ophthalmologic - fundi not visualized due to noncooperation.  Cardiovascular - Regular rhythm and rate.  Mental Status -  Level of arousal and orientation to time, place, and person were intact. Language including expression, naming, repetition, comprehension was assessed and found intact. Fund of Knowledge was assessed and was  intact.  Cranial Nerves II - XII - II - Visual field intact OU. III, IV, VI - Extraocular movements intact. V - Facial sensation intact bilaterally. VII - Facial movement intact bilaterally. VIII - Hearing & vestibular intact bilaterally. X - Palate elevates symmetrically. XI - Chin turning & shoulder shrug intact bilaterally. XII - Tongue protrusion intact.  Motor Strength - The patient's strength was normal in all extremities and pronator drift was absent.  Bulk was normal and fasciculations were absent.   Motor Tone - Muscle tone was assessed at the neck and appendages and was normal.  Reflexes - The patient's reflexes were symmetrical in all extremities and she had no pathological reflexes.  Sensory - Light touch, temperature/pinprick were assessed and were symmetrical.    Coordination - The patient had normal movements in the hands and feet with no ataxia or dysmetria.  Tremor was absent.  Gait and Station - deferred.   ASSESSMENT/PLAN Ms. NIAMH RADA is a 69 y.o. female with history of HTN, HLD, DVT/PE with factor V Leiden mutation, CAD s/p stenting in 2014, COPD and smoker admitted for right facial droop and numbness, with slurry speech. No TNK given due to symptoms resolved.    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 Discussed with Dr. Danton, will start on  Xarelto  too.  Patient counseled to be compliant with her antithrombotic medications Ongoing aggressive stroke risk factor management Therapy recommendations: none Disposition:  home  Left ICA high grade stenosis CTA  head and neck left ICA string sign Likely symptomatic left ICA stenosis VVS on board S/p left TCAR tomorrow with Dr. Gretta On DAPT   Hx of DVT/PE Factor V Leiden mutation According to the note from Dr. Raylene on 04/10/2019, pt had factor V leiden mutation and it was likely the cause of her DVT and PE S/p IVC filter in the past Was on long term warfarin but seems stopped a couple of years ago off heparin  IV, and plan for Xarelto  long term  ? Hx of afib in the setting of PE   Per pt, she was told to have afib at the time of DVT/PE.  Since then she did not follow up with any cardiology and she did not feel any irregular heart beat. She was followed by Dr. Edwyna in [place] in 2017 for CAD s/p stenting in 2014 and did not mention about afib.  She saw Dr. Raylene cardiology at [facility] in 03/2019 and did not mention about afib Tele monitoring this time showed NSR off heparin  IV now and will be put on Xarelto  long term  Hypertension Stable Long term BP goal normotensive  Hyperlipidemia Home meds:  crestor  20  LDL 62, goal < 70 Now on crestor  40 Continue statin at discharge  Tobacco abuse Current smoker Smoking cessation counseling provided Nicotine  patch provided Pt is willing to quit  Other Stroke Risk Factors Advanced age Coronary artery disease s/p stenting 2014  Other Active Problems COPD  Hospital day # 3  Neurology will sign off. Please call with questions. Pt will follow up with stroke clinic NP at [facility] in about 4 weeks. Thanks for the consult.   Ary Cummins, MD PhD Stroke Neurology 06/30/2024 11:57 AM    To contact Stroke Continuity provider, please refer to [email or web address]. After hours, contact General Neurology

## 2024-06-30 NOTE — Discharge Summary (Signed)
 DISCHARGE SUMMARY  Laura Hodges  MR#: [id number]  DOB:[birth year]  Date of Admission: 06/27/2024 Date of Discharge: 06/30/2024  Attending Physician:Nao Linz Laura Moores, MD  Patient's ERE:[place], No Pcp Per  Disposition: Discharge home  Follow-up Appts:  Follow-up Information     [facility] at [facility]. [facility] Follow up in 4 week(s).   Specialty: Vascular Surgery Why: Office will call to arrange your appt(s) Contact information: [address] [place]  [id number] [phone]        Your Primary Care Provider Follow up in 2 week(s).                  Tests Needing Follow-up: -assess for tolerance of triple plt/anticoag therapy - question on sx of bleeding  -assess abstinence from tobacco use   Discharge Diagnoses: Symptomatic critical left ICA stenosis Acute thromboembolic Strokes HTN HLD Chronic atrial fibrillation Factor V Leiden mutation with history of DVT and PE Tobacco abuse Chronic pain Depression/anxiety RLS  Initial presentation: 68 year old with a history of HTN, HLD, atrial fibrillation, CAD status post stenting, RLS, factor V Leiden mutation with DVT/PE status post IVC filter, depression, anxiety, COPD, obesity, and chronic low back pain who initially presented to [facility] 7/4 with right-sided facial numbness and tingling with slurred speech.  CT head revealed no acute abnormality.  CTa head and neck revealed chronic appearing near complete occlusion of the proximal left ICA.  MRI revealed no acute CVA but again confirmed critical stenosis of the left carotid artery with potential evidence of a clot and string sign.  She was transferred to [facility] for Vasc Surgery evaluation.   Hospital Course:  Symptomatic critical left ICA stenosis TCAR 7/7 per VVS - post-op care per VVS as follows: Will need aspirin  Plavix  statin at discharge for the carotid stent.  Will be on triple  therapy with a DOAC for A-fib for at least 1 month.  Discussed will arrange follow-up in 1 month in the office for carotid duplex. If stent looks good at that point can stop Plavix .    Acute thromboembolic Strokes -Neurology notes on MRI left MCA and MCA/ACA 3-4 small infarcts likely secondary to left ICA near occlusion though these findings are not mentioned in the official radiology report  -Presented initially with right facial droop, right facial numbness, and slurring of speech -comes resolved shortly after presentation -MRI brain w/o evidence of CVA on official report but noted critical LCA stenosis - Neurology feels MRI DOES show small acute CVAs as noted above -CTa head and neck noted short segment chronic appearing near complete occlusion of the proximal left ICA -TTE notes EF 60-65% with no WMA and normal diastolic function -medical treatment: Aspirin  plus Plavix  -LDL 62 -A1c 5.0 -PT/OT/SLP suggest no further oupt therapy indicated    HTN Blood pressure within goal range    HLD Continue usual home medical therapy with LDL 62   Chronic atrial fibrillation Has not been on anticoagulation for approximately 4 years - Xarelto  resumed during this admission   Factor V Leiden mutation with history of DVT and PE Has IVC filter -has not been on anticoagulation for approximately 4 years, initially due to financial constraints, and then was told by her PCP that she did not need it anymore per her history -given dual indication for anticoagulation (afib and Factor V mutation w/ hx of PE and indwelling IVC filter) lifetime use of anticoagulation is felt  most appropriate going forward   Tobacco abuse Actively smoking approximately 1 pack/day -counseled on absolute need to discontinue smoking and its direct connection to clot formation as well as vascular disease, stroke, heart attack   Chronic pain Continue usual oxycodone  dose   Depression/anxiety Continue usual home medications    RLS Continue usual home ropinirole  dose  Allergies as of 06/30/2024       Reactions   Morphine And Codeine Hives   Doxycycline Other (See Comments)   unknown        Medication List     TAKE these medications    acetaminophen  325 MG tablet Commonly known as: TYLENOL  Take 2 tablets (650 mg total) by mouth every 6 (six) hours as needed for mild pain (or Fever >/= 101).   aspirin  81 MG chewable tablet Chew 1 tablet (81 mg total) by mouth daily. Start taking on: July 01, 2024 Replaces: aspirin  EC 81 MG tablet   citalopram  40 MG tablet Commonly known as: CELEXA  Take 40 mg by mouth daily.   clopidogrel  75 MG tablet Commonly known as: PLAVIX  Take 1 tablet (75 mg total) by mouth daily. Start taking on: July 01, 2024 What changed: when to take this   diazepam  10 MG tablet Commonly known as: VALIUM  Take 10 mg by mouth every 12 (twelve) hours as needed for anxiety.   ferrous sulfate  325 (65 FE) MG tablet Take 1 tablet (325 mg total) by mouth 3 (three) times daily after meals.   levothyroxine  88 MCG tablet Commonly known as: SYNTHROID  Take 88 mcg by mouth daily before breakfast.   Linzess  145 MCG Caps capsule Generic drug: linaclotide  Take 145 mcg by mouth every morning.   meloxicam 7.5 MG tablet Commonly known as: MOBIC Take 7.5 mg by mouth daily.   metFORMIN 500 MG 24 hr tablet Commonly known as: GLUCOPHAGE-XR Take 500 mg by mouth daily.   methocarbamol  750 MG tablet Commonly known as: ROBAXIN  Take 750-1,500 mg by mouth 3 (three) times daily as needed.   omeprazole  40 MG capsule Commonly known as: PRILOSEC Take 40 mg by mouth 2 (two) times daily.   Oxycodone  HCl 10 MG Tabs Take 1 tablet (10 mg total) by mouth every 6 (six) hours as needed for severe pain (pain score 7-10) (Moderate pain, severe pain). What changed:  how much to take when to take this reasons to take this   polyethylene glycol 17 g packet Commonly known as: MIRALAX  / GLYCOLAX  Take 17 g  by mouth 2 (two) times daily.   pregabalin  300 MG capsule Commonly known as: LYRICA  Take 300 mg by mouth in the morning, at noon, and at bedtime.   progesterone 200 MG capsule Commonly known as: PROMETRIUM Take 200 mg by mouth at bedtime.   promethazine 25 MG tablet Commonly known as: PHENERGAN Take 25 mg by mouth every 6 (six) hours as needed for nausea.   rivaroxaban  20 MG Tabs tablet Commonly known as: XARELTO  Take 1 tablet (20 mg total) by mouth daily.   rOPINIRole  1 MG tablet Commonly known as: REQUIP  Take 1 mg by mouth 3 (three) times daily.   rosuvastatin  40 MG tablet Commonly known as: CRESTOR  Take 1 tablet (40 mg total) by mouth daily. Start taking on: July 01, 2024 What changed:  medication strength how much to take when to take this   Vitamin D (Ergocalciferol) 1.25 MG (50000 UNIT) Caps capsule Commonly known as: DRISDOL Take 50,000 Units by mouth once a week.   zolpidem  10  MG tablet Commonly known as: AMBIEN  Take 10 mg by mouth daily.        Day of Discharge BP (!) 145/87 (BP Location: Right Arm)   Pulse 92   Temp 98.2 F (36.8 C) (Oral)   Resp 18   Ht 5' 3 (1.6 m)   Wt 86 kg   SpO2 96%   BMI 33.60 kg/m   Physical Exam: General: No acute respiratory distress Lungs: Clear to auscultation bilaterally without wheezes or crackles Cardiovascular: Regular rate and rhythm without murmur gallop or rub normal S1 and S2 Abdomen: Nontender, nondistended, soft, bowel sounds positive, no rebound, no ascites, no appreciable mass Extremities: No significant cyanosis, clubbing, or edema bilateral lower extremities  Basic Metabolic Panel: Recent Labs  Lab 06/27/24 1819 06/28/24 0410 06/30/24 0500  NA 139 138 139  K 4.0 4.3 4.2  CL 104 102 107  CO2 26 29 24   GLUCOSE 103* 107* 189*  BUN 12 11 12   CREATININE 0.75 0.83 0.78  CALCIUM  9.2 9.2 9.0    CBC: Recent Labs  Lab 06/27/24 1819 06/28/24 0410 06/29/24 0534 06/30/24 0500  WBC 5.6 5.9 5.0  10.4  HGB 14.0 13.7 12.5 11.7*  HCT 41.2 41.4 38.1 34.6*  MCV 94.3 93.9 95.3 93.8  PLT 201 202 182 186     Time spent in discharge (includes decision making & examination of pt): 35 minutes  06/30/2024, 12:02 PM   Reyes IVAR Moores, MD [facility] Office  [phone]

## 2024-06-30 NOTE — Plan of Care (Signed)

## 2024-07-09 ENCOUNTER — Telehealth: Payer: Self-pay

## 2024-07-09 DIAGNOSIS — G459 Transient cerebral ischemic attack, unspecified: Secondary | ICD-10-CM

## 2024-07-09 NOTE — Patient Instructions (Signed)
 Visit Information  Thank you for taking time to visit with me today. Please don't hesitate to contact me if I can be of assistance to you   Reviewed benefits of smoking cessation. Reviewed 1-800-quitnow program and encouraged patient to call and get some support.            Reviewed importance of avoiding smoking due to vasoconstriction.    The patient verbalized understanding of instructions, educational materials, and care plan provided today and DECLINED offer to receive copy of patient instructions, educational materials, and care plan.   The patient has been provided with contact information for the care management team and has been advised to call with any health related questions or concerns.   Please call the care guide team at [phone] if you need to cancel or reschedule your appointment.   Please call the Suicide and Crisis Lifeline: 988 if you are experiencing a Mental Health or Behavioral Health Crisis or need someone to talk to.  Jamaine Quintin J. Navreet Bolda RN, MSN [facility], [facility] RN Care Manager Direct Dial: [phone]  Fax: [phone] Website: [email or web address]

## 2024-07-09 NOTE — Transitions of Care (Post Inpatient/ED Visit) (Signed)
 Stroke Discharge Follow-up   07/09/2024 Name:  NAYELLI INGLIS MRN:  [id number] DOB:  30-Apr-1956  Subjective: FATUMATA KASHANI is a 68 y.o. year old female who is a primary care patient of Patient, No Pcp Per An Emmi alert was received indicating patient responded to questions: Smoked or been around smoke?. I reached out by phone to follow up on the alert.Unsuccessful call attempt- left a message requesting a call back.    Care Management Interventions: Reviewed alert and attempted to reach patient   Follow up plan: Will attempt outreach on the next business day.  Shanaiya Bene J. Kamrin Spath RN, MSN [facility], [facility] Health RN Care Manager Direct Dial: [phone]  Fax: [phone] Website: [email or web address]

## 2024-07-09 NOTE — Transitions of Care (Post Inpatient/ED Visit) (Signed)
 Stroke Discharge Follow-up   07/09/2024 Name:  Laura Hodges MRN:  [id number] DOB:  12-Oct-1956  Subjective: Laura Hodges is a 68 y.o. year old female who is a primary care patient of Patient, No Pcp Per An Emmi alert was received indicating patient responded to questions: Smoked or been around smoke?. I reached out by phone to follow up on the alert and spoke to Patient. She states she still smokes but is working on stopping.  She has patches she is using.  Patient states she is from out of town but plans to be back home by the weekend.  She has her medications. Strongly urged her to set up a follow up with her PCP when she gets back home. She verbalized understanding.   Care Management Interventions:   Reviewed benefits of smoking cessation. Reviewed 1-800-quitnow program and encouraged patient to call and get some support        Reviewed importance of avoiding smoking due to vasoconstriction.   Follow up plan: No further intervention required.   Mariamawit Depaoli J. Ellenora Talton RN, MSN [facility], [facility] RN Care Manager Direct Dial: [phone]  Fax: [phone] Website: [email or web address]

## 2024-07-13 ENCOUNTER — Other Ambulatory Visit: Payer: Self-pay

## 2024-07-13 DIAGNOSIS — I6522 Occlusion and stenosis of left carotid artery: Secondary | ICD-10-CM

## 2024-07-16 ENCOUNTER — Telehealth: Payer: Self-pay

## 2024-07-16 DIAGNOSIS — G459 Transient cerebral ischemic attack, unspecified: Secondary | ICD-10-CM

## 2024-07-16 NOTE — Transitions of Care (Post Inpatient/ED Visit) (Signed)
 Stroke Discharge Follow-up   07/16/2024 Name:  Laura Hodges MRN:  [id number] DOB:  [birth year]  Subjective: Laura Hodges is a 68 y.o. year old female who is a primary care patient of Patient, No Pcp Per An Emmi alert was received indicating patient responded to questions: Went to follow-up appointment?. I reached out by phone to follow up on the alert and spoke to Patient.  Patient states she had not seen her primary care back at home as she had an appointment and had to wait too long so she left.  She state she has a PCP that she will be calling to schedule with.  Strongly advised her to call as soon as possible. She verbalized understanding.    Care Management Interventions: Discussed importance of proper follow up with PCP after a stroke and also importance of maintaining medications  Follow up plan: No further intervention required.   Sarafina Puthoff J. Laiah Pouncey RN, MSN [facility], [facility] RN Care Manager Direct Dial: [phone]  Fax: [phone] Website: [email or web address]

## 2024-07-16 NOTE — Patient Instructions (Signed)
 Visit Information  Thank you for taking time to visit with me today. Please don't hesitate to contact me if I can be of assistance to you.  Stroke Symptoms Sudden numbness or weakness in the face, arm, or leg, especially on one side of the body. Sudden confusion, trouble speaking, or difficulty understanding speech. Sudden trouble seeing in one or both eyes. Sudden trouble walking, dizziness, loss of balance, or lack of coordination. Sudden severe headache with no known cause. Call 9-1-1 right away if you or someone else has any of these symptoms. During a stroke, every minute counts! Fast treatment Fast treatment can lessen the brain damage that stroke can cause. By knowing the signs and symptoms of stroke, you can take quick action and perhaps save a life.   The patient verbalized understanding of instructions, educational materials, and care plan provided today and DECLINED offer to receive copy of patient instructions, educational materials, and care plan.   The patient has been provided with contact information for the care management team and has been advised to call with any health related questions or concerns.   Please call the care guide team at [phone] if you need to cancel or reschedule your appointment.   Please call the Suicide and Crisis Lifeline: 988 if you are experiencing a Mental Health or Behavioral Health Crisis or need someone to talk to.  Santhosh Gulino J. Delcenia Inman RN, MSN [facility], [facility] RN Care Manager Direct Dial: [phone]  Fax: [phone] Website: [email or web address]

## 2024-08-03 ENCOUNTER — Telehealth: Payer: Self-pay

## 2024-08-03 NOTE — Telephone Encounter (Signed)
 Pt called and left message reporting she's unable to walk much, exhausted -thinking blood thinner causing these problems Pt reports she hasn't felt like her self since stroke Pt now only takes a tiny sliver of the pill at night - wants to talk to someone who can tell her if these side effects are from the blood thinner Pt has appt tomorrow for u/s and PA but wants to talk to nurse today if possible  Returned call to pt who was audibly short of breath while talking on the phone Pt reports dizziness and that she's fallen several times Pt reports dark stool but not black  Advised pt to go to ED or urgent care. Pt hesitant to go because she doesn't want to miss her appts with [facility] tomorrow. Pt verbally agreed to go to urgent care.  She stated her son could take her.

## 2024-08-04 ENCOUNTER — Ambulatory Visit (INDEPENDENT_AMBULATORY_CARE_PROVIDER_SITE_OTHER): Admitting: Physician Assistant

## 2024-08-04 ENCOUNTER — Ambulatory Visit (HOSPITAL_COMMUNITY)
Admission: RE | Admit: 2024-08-04 | Discharge: 2024-08-04 | Disposition: A | Discharge status: Home / Self Care | Source: Ambulatory Visit | Attending: Vascular Surgery | Admitting: Vascular Surgery

## 2024-08-04 VITALS — BP 115/57 | HR 79 | Temp 97.9°F | Wt 197.9 lb

## 2024-08-04 DIAGNOSIS — I6522 Occlusion and stenosis of left carotid artery: Secondary | ICD-10-CM

## 2024-08-04 NOTE — Progress Notes (Signed)
 POST OPERATIVE OFFICE NOTE    CC:  F/u for surgery  HPI:  This is a 68 y.o. female who is s/p Left TCAR on 06/29/26 by Dr. Gretta.  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 She did well post operatively and was discharged home POD#1  Pt returns today for follow up with ultrasound.  Pt states overall she just has not been feeling herself since her stroke. She says the Xarelto  has been making her dizzy and just  feel bad. She has been taking only a half dose at night. She feels overall very fatigued. her legs feel weak and heavy. She has had several falls. She says she cannot walk far or they will give out. She also has been short winded. She reports her weight fluctuating a lot as well. She feels her voice is hoarse or like she is whispering. She otherwise denies any visual changes, slurred speech, facial drooping, unilateal upper or lower extremity weakness or numbness. She presently does not have a PCP because she says her prior one is no longer covered by her insurance. She has not seen Neurology since her hospital discharge. She is medically managed on Aspirin , Plavix , Xarelto  and statin  Allergies  Allergen Reactions   Morphine And Codeine Hives   Doxycycline Other (See Comments)    unknown    Current Outpatient Medications  Medication Sig Dispense Refill   acetaminophen  (TYLENOL ) 325 MG tablet Take 2 tablets (650 mg total) by mouth every 6 (six) hours as needed for mild pain (or Fever >/= 101).     aspirin  81 MG chewable tablet Chew 1 tablet (81 mg total) by mouth daily. 90 tablet 2   citalopram  (CELEXA ) 40 MG tablet Take 40 mg by mouth daily.     clopidogrel  (PLAVIX ) 75 MG tablet Take 1 tablet (75 mg total) by mouth daily. 30 tablet 0   diazepam  (VALIUM ) 10 MG tablet Take 10 mg by mouth every 12 (twelve) hours as needed for anxiety.     ferrous sulfate  325 (65 FE) MG tablet Take 1 tablet (325 mg total) by mouth 3 (three) times daily after meals.   3   levothyroxine  (SYNTHROID ) 88 MCG tablet Take 88 mcg by mouth daily before breakfast.     LINZESS  145 MCG CAPS capsule Take 145 mcg by mouth every morning.     meloxicam (MOBIC) 7.5 MG tablet Take 7.5 mg by mouth daily.     metFORMIN (GLUCOPHAGE-XR) 500 MG 24 hr tablet Take 500 mg by mouth daily.     methocarbamol  (ROBAXIN ) 750 MG tablet Take 750-1,500 mg by mouth 3 (three) times daily as needed.     omeprazole  (PRILOSEC) 40 MG capsule Take 1 capsule (40 mg total) by mouth 2 (two) times daily.     pantoprazole  (PROTONIX ) 40 MG tablet Take 1 tablet (40 mg total) by mouth daily. 30 tablet 0   polyethylene glycol (MIRALAX  / GLYCOLAX ) packet Take 17 g by mouth 2 (two) times daily. 14 each 0   pregabalin  (LYRICA ) 300 MG capsule Take 300 mg by mouth in the morning, at noon, and at bedtime.     progesterone (PROMETRIUM) 200 MG capsule Take 200 mg by mouth at bedtime.     promethazine (PHENERGAN) 25 MG tablet Take 25 mg by mouth every 6 (six) hours as needed for nausea.     rivaroxaban  (XARELTO ) 20 MG TABS tablet Take 1 tablet (20 mg total) by mouth daily. 90 tablet 2  rOPINIRole  (REQUIP ) 1 MG tablet Take 1 mg by mouth 3 (three) times daily.     rosuvastatin  (CRESTOR ) 40 MG tablet Take 1 tablet (40 mg total) by mouth daily. 30 tablet 2   Vitamin D, Ergocalciferol, (DRISDOL) 1.25 MG (50000 UNIT) CAPS capsule Take 50,000 Units by mouth once a week.     zolpidem  (AMBIEN ) 10 MG tablet Take 10 mg by mouth daily.     No current facility-administered medications for this visit.     ROS:  See HPI  Physical Exam:  Vitals:   08/04/24 1333 08/04/24 1337  BP: 113/72 (!) 115/57  Pulse: 79   Temp: 97.9 F (36.6 C)     Incision:  Left neck incision is healing very well. No swelling or hematoma Cardiac: regular Lungs: non labored Extremities:  moving all extremities without deficits. 2+ radial and DP pulses bilaterally Neuro: alert and oriented. Speech coherent  Non invasive Vascular lab: VAS US   Carotid Duplex Bilateral: Summary:  Right Carotid: Velocities in the right ICA are consistent with a 40-59% stenosis. The ECA appears >50% stenosed.   Left Carotid: The ECA appears <50% stenosed. Patent left prox ICA to distal ICA stent, <50% stenosis is seen within the stent.   Vertebrals:  Bilateral vertebral arteries demonstrate antegrade flow.  Subclavians: Right subclavian artery flow was disturbed. Normal flow hemodynamics were seen in the left subclavian artery.   Assessment/Plan:  This is a 68 y.o. female who is s/p: Left TCAR for symptomatic high grade stenosis with stroke. Her incision is healing very well. She remains neurologically intact. She however has had a lot of other vague symptoms following her hospitalization. Many of her symptoms are likely result of her stroke however she is struggling and frustrated by the effects. Her duplex today shows patent left ICA stent. She has 40-50% stenosis of her right ICA. Her vertebral and subclavian arteries are patent.  - Reassured her that from a vascular standpoint everything is looking good - Continue Aspirin  and Xarelto . She is okay to discontinue her Plavix  at this time - Recommend she try to establish new PCP to address her medical concerns following her hospitalization - reviewed signs and symptoms of TIA/Stroke and she understands should this occur to seek immediate medical attention - she will follow up in 6 months with carotid duplex   Curry Damme, [facility] [phone]   On call MD:  Sheree

## 2024-08-04 NOTE — Progress Notes (Unsigned)
 POST OPERATIVE OFFICE NOTE    CC:  F/u for surgery  HPI:  This is a 68 y.o. female who is s/p Left TCAR on 06/29/26 by Dr. Gretta.  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 She did well post operatively and was discharged home POD#1  Pt returns today for follow up with ultrasound.  Pt states overall she just has not been feeling herself since her stroke. She says the Xarelto  has been making her dizzy and just  feel bad. She has been taking only a half dose at night. She feels overall very fatigued. her legs feel weak and heavy. She has had several falls. She says she cannot walk far or they will give out. She also has been short winded. She reports her weight fluctuating a lot as well. She feels her voice is hoarse or like she is whispering. She otherwise denies any visual changes, slurred speech, facial drooping, unilateal upper or lower extremity weakness or numbness. She presently does not have a PCP because she says her prior one is no longer covered by her insurance. She has not seen Neurology since her hospital discharge. She is medically managed on Aspirin , Plavix , Xarelto  and statin  Allergies  Allergen Reactions   Morphine And Codeine Hives   Doxycycline Other (See Comments)    unknown    Current Outpatient Medications  Medication Sig Dispense Refill   acetaminophen  (TYLENOL ) 325 MG tablet Take 2 tablets (650 mg total) by mouth every 6 (six) hours as needed for mild pain (or Fever >/= 101).     aspirin  81 MG chewable tablet Chew 1 tablet (81 mg total) by mouth daily. 90 tablet 2   citalopram  (CELEXA ) 40 MG tablet Take 40 mg by mouth daily.     clopidogrel  (PLAVIX ) 75 MG tablet Take 1 tablet (75 mg total) by mouth daily. 30 tablet 0   diazepam  (VALIUM ) 10 MG tablet Take 10 mg by mouth every 12 (twelve) hours as needed for anxiety.     ferrous sulfate  325 (65 FE) MG tablet Take 1 tablet (325 mg total) by mouth 3 (three) times daily after meals.   3   levothyroxine  (SYNTHROID ) 88 MCG tablet Take 88 mcg by mouth daily before breakfast.     LINZESS  145 MCG CAPS capsule Take 145 mcg by mouth every morning.     meloxicam (MOBIC) 7.5 MG tablet Take 7.5 mg by mouth daily.     metFORMIN (GLUCOPHAGE-XR) 500 MG 24 hr tablet Take 500 mg by mouth daily.     methocarbamol  (ROBAXIN ) 750 MG tablet Take 750-1,500 mg by mouth 3 (three) times daily as needed.     omeprazole  (PRILOSEC) 40 MG capsule Take 1 capsule (40 mg total) by mouth 2 (two) times daily.     pantoprazole  (PROTONIX ) 40 MG tablet Take 1 tablet (40 mg total) by mouth daily. 30 tablet 0   polyethylene glycol (MIRALAX  / GLYCOLAX ) packet Take 17 g by mouth 2 (two) times daily. 14 each 0   pregabalin  (LYRICA ) 300 MG capsule Take 300 mg by mouth in the morning, at noon, and at bedtime.     progesterone (PROMETRIUM) 200 MG capsule Take 200 mg by mouth at bedtime.     promethazine (PHENERGAN) 25 MG tablet Take 25 mg by mouth every 6 (six) hours as needed for nausea.     rivaroxaban  (XARELTO ) 20 MG TABS tablet Take 1 tablet (20 mg total) by mouth daily. 90 tablet 2  rOPINIRole  (REQUIP ) 1 MG tablet Take 1 mg by mouth 3 (three) times daily.     rosuvastatin  (CRESTOR ) 40 MG tablet Take 1 tablet (40 mg total) by mouth daily. 30 tablet 2   Vitamin D, Ergocalciferol, (DRISDOL) 1.25 MG (50000 UNIT) CAPS capsule Take 50,000 Units by mouth once a week.     zolpidem  (AMBIEN ) 10 MG tablet Take 10 mg by mouth daily.     No current facility-administered medications for this visit.     ROS:  See HPI  Physical Exam:  Vitals:   08/04/24 1333 08/04/24 1337  BP: 113/72 (!) 115/57  Pulse: 79   Temp: 97.9 F (36.6 C)     Incision:  Left neck incision is healing very well. No swelling or hematoma Cardiac: regular Lungs: non labored Extremities:  moving all extremities without deficits. 2+ radial and DP pulses bilaterally Neuro: alert and oriented. Speech coherent  Non invasive Vascular lab: VAS US   Carotid Duplex Bilateral: Summary:  Right Carotid: Velocities in the right ICA are consistent with a 40-59% stenosis. The ECA appears >50% stenosed.   Left Carotid: The ECA appears <50% stenosed. Patent left prox ICA to distal ICA stent, <50% stenosis is seen within the stent.   Vertebrals:  Bilateral vertebral arteries demonstrate antegrade flow.  Subclavians: Right subclavian artery flow was disturbed. Normal flow hemodynamics were seen in the left subclavian artery.   Assessment/Plan:  This is a 68 y.o. female who is s/p: Left TCAR for symptomatic high grade stenosis with stroke. Her incision is healing very well. She remains neurologically intact. She however has had a lot of other vague symptoms following her hospitalization. Many of her symptoms are likely result of her stroke. Her duplex today shows patent left ICA stent. She has 40-50% stenosis of her right ICA. Her vertebral and subclavian arteries are patent.  -  - Continue Aspirin  and Xarelto . She is okay to discontinue her Plavix  at this time - Recommend she try to establish new PCP to address her medical concerns - she will follow up in 6 months with carotid duplex   Curry Damme, [facility] [phone]   On call MD:  Sheree

## 2024-08-05 ENCOUNTER — Encounter: Payer: Self-pay | Admitting: Physician Assistant

## 2024-09-15 ENCOUNTER — Inpatient Hospital Stay: Admitting: Diagnostic Neuroimaging

## 2024-09-15 ENCOUNTER — Encounter: Payer: Self-pay | Admitting: Diagnostic Neuroimaging

## 2025-01-15 ENCOUNTER — Other Ambulatory Visit: Payer: Self-pay

## 2025-01-15 DIAGNOSIS — I6522 Occlusion and stenosis of left carotid artery: Secondary | ICD-10-CM

## 2025-02-09 ENCOUNTER — Encounter (HOSPITAL_COMMUNITY)

## 2025-02-09 ENCOUNTER — Ambulatory Visit
